# Patient Record
Sex: Female | Born: 1965 | Hispanic: No | Marital: Single | Smoking: Never smoker
Health system: Southern US, Community
[De-identification: ages and names within clinical notes are randomized; demographics above are authoritative.]

## PROBLEM LIST (undated history)

## (undated) DIAGNOSIS — E119 Type 2 diabetes mellitus without complications: Secondary | ICD-10-CM

## (undated) DIAGNOSIS — E785 Hyperlipidemia, unspecified: Secondary | ICD-10-CM

## (undated) DIAGNOSIS — I10 Essential (primary) hypertension: Secondary | ICD-10-CM

## (undated) DIAGNOSIS — D649 Anemia, unspecified: Secondary | ICD-10-CM

## (undated) DIAGNOSIS — I639 Cerebral infarction, unspecified: Secondary | ICD-10-CM

## (undated) HISTORY — DX: Essential (primary) hypertension: I10

## (undated) HISTORY — DX: Cerebral infarction, unspecified: I63.9

## (undated) HISTORY — DX: Anemia, unspecified: D64.9

## (undated) HISTORY — DX: Type 2 diabetes mellitus without complications: E11.9

---

## 1997-09-03 HISTORY — PX: THYROID CYST EXCISION: SHX2511

## 1999-09-04 HISTORY — PX: OVARIAN CYST REMOVAL: SHX89

## 2006-09-03 HISTORY — PX: KNEE ARTHROSCOPY: SUR90

## 2007-03-05 ENCOUNTER — Encounter: Payer: Self-pay | Admitting: Internal Medicine

## 2007-03-05 ENCOUNTER — Ambulatory Visit: Payer: Self-pay | Admitting: Oncology

## 2007-03-05 ENCOUNTER — Ambulatory Visit: Payer: Self-pay | Admitting: Internal Medicine

## 2007-03-16 DIAGNOSIS — R7309 Other abnormal glucose: Secondary | ICD-10-CM | POA: Insufficient documentation

## 2007-03-16 DIAGNOSIS — D472 Monoclonal gammopathy: Secondary | ICD-10-CM | POA: Insufficient documentation

## 2007-03-16 DIAGNOSIS — I10 Essential (primary) hypertension: Secondary | ICD-10-CM

## 2007-03-16 DIAGNOSIS — D259 Leiomyoma of uterus, unspecified: Secondary | ICD-10-CM | POA: Insufficient documentation

## 2007-03-16 DIAGNOSIS — G478 Other sleep disorders: Secondary | ICD-10-CM | POA: Insufficient documentation

## 2007-04-18 ENCOUNTER — Encounter: Payer: Self-pay | Admitting: Internal Medicine

## 2007-04-18 LAB — CBC WITH DIFFERENTIAL/PLATELET
Basophils Absolute: 0 10*3/uL (ref 0.0–0.1)
EOS%: 2.4 % (ref 0.0–7.0)
HCT: 30.4 % — ABNORMAL LOW (ref 34.8–46.6)
HGB: 10.2 g/dL — ABNORMAL LOW (ref 11.6–15.9)
MCH: 22.5 pg — ABNORMAL LOW (ref 26.0–34.0)
MCV: 67.1 fL — ABNORMAL LOW (ref 81.0–101.0)
MONO%: 10.2 % (ref 0.0–13.0)
NEUT%: 49.7 % (ref 39.6–76.8)
RDW: 17 % — ABNORMAL HIGH (ref 11.3–14.5)

## 2007-04-18 LAB — COMPREHENSIVE METABOLIC PANEL
Alkaline Phosphatase: 47 U/L (ref 39–117)
BUN: 9 mg/dL (ref 6–23)
Glucose, Bld: 108 mg/dL — ABNORMAL HIGH (ref 70–99)
Total Bilirubin: 0.5 mg/dL (ref 0.3–1.2)

## 2007-04-22 LAB — HEMOGLOBINOPATHY EVALUATION
Hemoglobin Other: 0 % (ref 0.0–0.0)
Hgb A2 Quant: 3.8 % — ABNORMAL HIGH (ref 2.2–3.2)
Hgb A: 63.7 % — ABNORMAL LOW (ref 96.8–97.8)
Hgb S Quant: 32.5 % — ABNORMAL HIGH (ref 0.0–0.0)

## 2007-04-22 LAB — SPEP & IFE WITH QIG
Alpha-1-Globulin: 4.4 % (ref 2.9–4.9)
Alpha-2-Globulin: 10.9 % (ref 7.1–11.8)
M-Spike, %: 0.56 g/dL
Total Protein, Serum Electrophoresis: 7 g/dL (ref 6.0–8.3)

## 2007-04-22 LAB — FERRITIN: Ferritin: 31 ng/mL (ref 10–291)

## 2007-04-22 LAB — IRON AND TIBC
%SAT: 9 % — ABNORMAL LOW (ref 20–55)
Iron: 23 ug/dL — ABNORMAL LOW (ref 42–145)
UIBC: 247 ug/dL

## 2007-05-26 ENCOUNTER — Ambulatory Visit: Payer: Self-pay | Admitting: Oncology

## 2007-05-27 ENCOUNTER — Telehealth: Payer: Self-pay | Admitting: Internal Medicine

## 2007-05-28 ENCOUNTER — Encounter: Payer: Self-pay | Admitting: Internal Medicine

## 2007-05-28 LAB — UIFE/LIGHT CHAINS/TP QN, 24-HR UR
Free Lt Chn Excr Rate: 100.43 mg/d
Total Protein, Urine-Ur/day: 108 mg/d (ref 10–140)

## 2007-08-20 ENCOUNTER — Ambulatory Visit: Payer: Self-pay | Admitting: Oncology

## 2011-01-16 NOTE — Assessment & Plan Note (Signed)
[facility]                            [facility] OFFICE NOTE   NAME:Jessica Lowe, Colleene                     MRN:          [id number]  DATE:03/05/2007                            DOB:          1965/10/28    CHIEF COMPLAINT:  New patient to establish to have knee surgery on July  18.   HISTORY OF PRESENT ILLNESS:  Ms. Jessica Lowe is a 45 year old nonsmoking,  single, African American female who comes in today for a first-time  visit.  Her parents are originally from [place] but she was born and  raised in [place] and is now in this area.  She has a diagnosis of  hypertension on medication since about 2003, history of glucose  intolerance, possible insulin resistance, and had been seen by an  Endocrinologist in [place], [place] before moving here.  Last visit we  believe was Match 2008 and she does bring records.  She had been on  metformin at one point but is off at present.  She had evaluation for a  sleep disturbance and apparently had a negative sleep study without  obstructive sleep apnea.  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  She has an  injury to her right knee and surgery is planned March 21, 2007.  Injury  sounds like a torn ligament of some sort.  She also is interested in  conception and her OB/GYN has recommended her diuretic medicine be  changed to either labetalol, aldomet, or Procardia.   PAST MEDICAL HISTORY:  See database.   SURGERIES:  1. In 1997 partial thyroidectomy.  2. In 2001 abdominal cyst removal.   She has had chickenpox in the past, borderline diabetes, hypertension,  positive TB skin test as above.   She is gravida 1, para 0, last Pap March 05, 2007.  LMP February 11, 2007.  Mammogram 2007.  Unsure of her last tetanus shot and Pneumovax October  2007.   FAMILY HISTORY:  Positive for type 2 diabetes in mother and maternal  aunt.  Uterine-type cancer in a  grandparent.   MEDICATIONS:  1. Triamterine.  2. Hydrochlorothiazide 37.5/25 one p.o. daily.  3. Calcium.  4. Multivitamins.  5. Vitamin E.   DRUG ALLERGIES:  NONE KNOWN.   SOCIAL HISTORY:  Household of 7, living with sister and their family.  Negative TAD, see database.  Employed as a Cabin crew.   On review of past records it also appears that she has a history of iron  deficiency anemia, fibroid uterus, monoclonal gammopathy.   REVIEW OF SYSTEMS:  Noted for chest pain, shortness of breath, she is  interested in weight loss and has a hard time losing weight.  She says  she has tried everything, is interested in Lap-band procedure.   OBJECTIVE:  Height 5 foot 8-3/4 inches, weight 331 pounds, pulse 66 and  regular, blood pressure 120/70.  WDWN, large but healthy appearing  middle aged lady in no acute distress.  HEENT:  Grossly normal.  NECK:  Supple without masses or adenopathy  noted.  CHEST:  CTA, BS equal.  CARDIAC:  S1-S2, no gallops or murmurs.  Peripheral pulses are present  without delay.  ABDOMEN:  Soft without organomegaly, guarding, or rebound.  NEUROLOGIC:  Appears grossly intact.   REVIEW OF LABS:  From Dr. __________ are in chart.   IMPRESSION:  1. Hypertension.  2. Glucose intolerance, probably insulin resistance.  3. Elevated body mass index.  4. Knee ligament injury, presurgical.  5. Based on chart, question of MGUS.   PLAN:  Prescription given Procardia XL 60 one p.o. daily to begin after  her surgery since her hypertension is controlled on diuretics at present  and new information given on calling surgery, considering Lap-band  although she needs to be convinced and attempting lifestyle changes  also.  Will follow up in about 6-8 weeks or as needed.     Neta Mends. Panosh, MD  Electronically Signed    WKP/MedQ  DD: 03/05/2007  DT: 03/06/2007  Job #: [id number]

## 2013-12-10 ENCOUNTER — Other Ambulatory Visit: Payer: Self-pay | Admitting: Family Medicine

## 2014-02-22 ENCOUNTER — Ambulatory Visit: Payer: Self-pay

## 2014-03-14 ENCOUNTER — Other Ambulatory Visit: Payer: Self-pay | Admitting: Family Medicine

## 2014-03-22 ENCOUNTER — Encounter: Payer: Self-pay | Admitting: Internal Medicine

## 2014-03-22 ENCOUNTER — Ambulatory Visit: Payer: Self-pay | Attending: Internal Medicine | Admitting: Internal Medicine

## 2014-03-22 VITALS — BP 125/75 | HR 82 | Temp 97.9°F | Resp 20 | Ht 70.0 in | Wt 344.6 lb

## 2014-03-22 DIAGNOSIS — I10 Essential (primary) hypertension: Secondary | ICD-10-CM

## 2014-03-22 DIAGNOSIS — E119 Type 2 diabetes mellitus without complications: Secondary | ICD-10-CM

## 2014-03-22 LAB — GLUCOSE, POCT (MANUAL RESULT ENTRY): POC Glucose: 182 mg/dl — AB (ref 70–99)

## 2014-03-22 LAB — POCT GLYCOSYLATED HEMOGLOBIN (HGB A1C): HEMOGLOBIN A1C: 6.3

## 2014-03-22 MED ORDER — METFORMIN HCL 500 MG PO TABS: 500.0000 mg | ORAL_TABLET | Freq: Every morning | ORAL | Status: DC

## 2014-03-22 MED ORDER — TRIAMTERENE-HCTZ 37.5-25 MG PO CAPS: 1.0000 | ORAL_CAPSULE | Freq: Every day | ORAL | Status: DC

## 2014-03-22 NOTE — Patient Instructions (Signed)
Diabetes and Foot Care Diabetes may cause you to have problems because of poor blood supply (circulation) to your feet and legs. This may cause the skin on your feet to become thinner, break easier, and heal more slowly. Your skin may become dry, and the skin may peel and crack. You may also have nerve damage in your legs and feet causing decreased feeling in them. You may not notice minor injuries to your feet that could lead to infections or more serious problems. Taking care of your feet is one of the most important things you can do for yourself.  HOME CARE INSTRUCTIONS  Wear shoes at all times, even in the house. Do not go barefoot. Bare feet are easily injured.  Check your feet daily for blisters, cuts, and redness. If you cannot see the bottom of your feet, use a mirror or ask someone for help.  Wash your feet with warm water (do not use hot water) and mild soap. Then pat your feet and the areas between your toes until they are completely dry. Do not soak your feet as this can dry your skin.  Apply a moisturizing lotion or petroleum jelly (that does not contain alcohol and is unscented) to the skin on your feet and to dry, brittle toenails. Do not apply lotion between your toes.  Trim your toenails straight across. Do not dig under them or around the cuticle. File the edges of your nails with an emery board or nail file.  Do not cut corns or calluses or try to remove them with medicine.  Wear clean socks or stockings every day. Make sure they are not too tight. Do not wear knee-high stockings since they may decrease blood flow to your legs.  Wear shoes that fit properly and have enough cushioning. To break in new shoes, wear them for just a few hours a day. This prevents you from injuring your feet. Always look in your shoes before you put them on to be sure there are no objects inside.  Do not cross your legs. This may decrease the blood flow to your feet.  If you find a minor scrape,  cut, or break in the skin on your feet, keep it and the skin around it clean and dry. These areas may be cleansed with mild soap and water. Do not cleanse the area with peroxide, alcohol, or iodine.  When you remove an adhesive bandage, be sure not to damage the skin around it.  If you have a wound, look at it several times a day to make sure it is healing.  Do not use heating pads or hot water bottles. They may burn your skin. If you have lost feeling in your feet or legs, you may not know it is happening until it is too late.  Make sure your health care provider performs a complete foot exam at least annually or more often if you have foot problems. Report any cuts, sores, or bruises to your health care provider immediately. SEEK MEDICAL CARE IF:   You have an injury that is not healing.  You have cuts or breaks in the skin.  You have an ingrown nail.  You notice redness on your legs or feet.  You feel burning or tingling in your legs or feet.  You have pain or cramps in your legs and feet.  Your legs or feet are numb.  Your feet always feel cold. SEEK IMMEDIATE MEDICAL CARE IF:   There is increasing redness,   swelling, or pain in or around a wound.  There is a red line that goes up your leg.  Pus is coming from a wound.  You develop a fever or as directed by your health care provider.  You notice a bad smell coming from an ulcer or wound. Document Released: 08/17/2000 Document Revised: 04/22/2013 Document Reviewed: 01/27/2013 ExitCare Patient Information 2015 [employer]. This information is not intended to replace advice given to you by your health care provider. Make sure you discuss any questions you have with your health care provider.  

## 2014-03-22 NOTE — Progress Notes (Signed)
Patient presents to establish care for HTN and DM C/O 1 week history of right mid back pain; rates 5/10. Denies injury Requesting med refills

## 2014-03-22 NOTE — Progress Notes (Signed)
Patient ID: Jessica Lowe, female   DOB: 1966/02/16, 48 y.o.   MRN: [id number]   EGB:[id number]  [id number]:[id number]  DOB - 1965-11-26  CC:  Chief Complaint  Patient presents with  . Establish Care  . Diabetes  . Hypertension       HPI: Jessica Lowe is a 48 y.o. female here today to establish medical care.  Patient has a past medical history of DM and HTN.  She is currently taking Metformin 500 mg once daily and has been on that same dose for over one year and has been controlled.  Patient reports compliance with medication management.    Patient has No headache, No chest pain, No abdominal pain - No Nausea, No new weakness tingling or numbness, No Cough - SOB.  No Known Allergies Past Medical History  Diagnosis Date  . Anemia   . Diabetes mellitus without complication   . Hypertension    No current outpatient prescriptions on file prior to visit.   No current facility-administered medications on file prior to visit.   Family History  Problem Relation Age of Onset  . Diabetes Mother   . Hypertension Mother   . Prostatitis Father   . Hypertension Father    History   Social History  . Marital Status: Single    Spouse Name: N/A    Number of Children: N/A  . Years of Education: N/A   Occupational History  . Not on file.   Social History Main Topics  . Smoking status: Never Smoker   . Smokeless tobacco: Not on file  . Alcohol Use: No  . Drug Use: No  . Sexual Activity: Not on file   Other Topics Concern  . Not on file   Social History Narrative  . No narrative on file   Review of Systems  Eyes: Positive for blurred vision.  Respiratory: Negative.   Cardiovascular: Negative.   Gastrointestinal: Negative.   Genitourinary: Negative.   Musculoskeletal: Negative.   Neurological: Negative for dizziness, tingling and headaches.  Endo/Heme/Allergies: Negative for polydipsia.  Psychiatric/Behavioral: Negative.        Objective:   Filed Vitals:   03/22/14 1432  BP: 125/75  Pulse: 82  Temp: 97.9 F (36.6 C)  Resp: 20    Physical Exam: Constitutional: Patient appears well-developed and well-nourished. No distress. HENT: Normocephalic, atraumatic, External right and left ear normal. Oropharynx is clear and moist.  Eyes: Conjunctivae and EOM are normal. PERRLA, no scleral icterus. Neck: Normal ROM. Neck supple. No JVD. No tracheal deviation. No thyromegaly. CVS: RRR, S1/S2 +, no murmurs, no gallops, no carotid bruit.  Pulmonary: Effort and breath sounds normal, no stridor, rhonchi, wheezes, rales.  Abdominal: Soft. BS +, no distension, tenderness, rebound or guarding.  Musculoskeletal: Normal range of motion. No edema and no tenderness.  Lymphadenopathy: No lymphadenopathy noted, cervical, inguinal or axillary Neuro: Alert. Normal reflexes, muscle tone coordination. No cranial nerve deficit. Skin: Skin is warm and dry. No rash noted. Not diaphoretic. No erythema. No pallor. Psychiatric: Normal mood and affect. Behavior, judgment, thought content normal.  Lab Results  Component Value Date   WBC 5.0 04/18/2007   HGB 10.2* 04/18/2007   HCT 30.4* 04/18/2007   MCV 67.1* 04/18/2007   PLT 389 04/18/2007   Lab Results  Component Value Date   CREATININE 0.71 04/18/2007   BUN 9 04/18/2007   NA 140 04/18/2007   K 3.5 04/18/2007   CL 104 04/18/2007   CO2 27 04/18/2007    Lab Results  Component Value Date   HGBA1C 6.3 03/22/2014   Lipid Panel  No results found for this basename: chol, trig, hdl, cholhdl, vldl, ldlcalc       Assessment and plan:   Jessica Lowe was seen today for establish care, diabetes and hypertension.  Diagnoses and associated orders for this visit:  Type II or unspecified type diabetes mellitus without mention of complication, not stated as uncontrolled - Glucose (CBG) - HgB A1c - metFORMIN (GLUCOPHAGE) 500 MG tablet; Take 1 tablet (500 mg total) by mouth every morning. Will continue same dose - Ambulatory  referral to Ophthalmology - Lipid panel; Future - COMPLETE METABOLIC PANEL WITH GFR; Future - CBC; Future  HYPERTENSION - May continue current med triamterene-hydrochlorothiazide (DYAZIDE) 37.5-25 MG per capsule; Take 1 each (1 capsule total) by mouth daily.   Return for Lab Visit soon and 3 mo PCP, DM/HTN.   Chari Manning, NP-C [facility] [phone] 03/23/2014, 7:40 PM

## 2014-03-23 ENCOUNTER — Encounter: Payer: Self-pay | Admitting: Internal Medicine

## 2014-04-16 ENCOUNTER — Ambulatory Visit: Payer: Self-pay

## 2014-04-16 ENCOUNTER — Other Ambulatory Visit: Payer: Self-pay

## 2014-04-20 ENCOUNTER — Ambulatory Visit: Payer: Self-pay | Attending: Internal Medicine

## 2014-04-20 DIAGNOSIS — E119 Type 2 diabetes mellitus without complications: Secondary | ICD-10-CM

## 2014-04-20 LAB — COMPLETE METABOLIC PANEL WITH GFR
ALK PHOS: 64 U/L (ref 39–117)
ALT: 10 U/L (ref 0–35)
AST: 12 U/L (ref 0–37)
Albumin: 3.9 g/dL (ref 3.5–5.2)
BILIRUBIN TOTAL: 0.4 mg/dL (ref 0.2–1.2)
BUN: 10 mg/dL (ref 6–23)
CO2: 33 mEq/L — ABNORMAL HIGH (ref 19–32)
Calcium: 9.3 mg/dL (ref 8.4–10.5)
Chloride: 100 mEq/L (ref 96–112)
Creat: 0.63 mg/dL (ref 0.50–1.10)
GFR, Est African American: >89 mL/min
Glucose, Bld: 102 mg/dL — ABNORMAL HIGH (ref 70–99)
Potassium: 3.9 mEq/L (ref 3.5–5.3)
Sodium: 138 mEq/L (ref 135–145)
Total Protein: 7.6 g/dL (ref 6.0–8.3)

## 2014-04-20 LAB — LIPID PANEL
CHOL/HDL RATIO: 3 ratio
CHOLESTEROL: 187 mg/dL (ref 0–200)
HDL: 62 mg/dL (ref >39)
LDL Cholesterol: 112 mg/dL — ABNORMAL HIGH (ref 0–99)
Triglycerides: 67 mg/dL (ref <150)
VLDL: 13 mg/dL (ref 0–40)

## 2014-04-20 LAB — CBC
HEMATOCRIT: 37.7 % (ref 36.0–46.0)
HEMOGLOBIN: 12.7 g/dL (ref 12.0–15.0)
MCH: 24.5 pg — ABNORMAL LOW (ref 26.0–34.0)
MCHC: 33.7 g/dL (ref 30.0–36.0)
MCV: 72.8 fL — ABNORMAL LOW (ref 78.0–100.0)
Platelets: 331 10*3/uL (ref 150–400)
RBC: 5.18 MIL/uL — AB (ref 3.87–5.11)
RDW: 15.5 % (ref 11.5–15.5)
WBC: 5.7 10*3/uL (ref 4.0–10.5)

## 2014-04-21 ENCOUNTER — Other Ambulatory Visit: Payer: Self-pay

## 2014-04-26 ENCOUNTER — Telehealth: Payer: Self-pay | Admitting: *Deleted

## 2014-04-26 NOTE — Telephone Encounter (Signed)
Message copied by Betti Cruz on Mon Apr 26, 2014  4:13 PM ------      Message from: Chari Manning A      Created: Fri Apr 23, 2014  9:22 AM       Labs look good, please educate on cholesterol-regarding diet and exercise ------

## 2014-04-26 NOTE — Telephone Encounter (Signed)
Try both number provided, both number were wrong business numbers

## 2014-05-20 ENCOUNTER — Ambulatory Visit (INDEPENDENT_AMBULATORY_CARE_PROVIDER_SITE_OTHER): Payer: Self-pay | Admitting: Home Health Services

## 2014-05-20 ENCOUNTER — Ambulatory Visit: Payer: Self-pay | Attending: Internal Medicine

## 2014-05-20 DIAGNOSIS — E119 Type 2 diabetes mellitus without complications: Secondary | ICD-10-CM

## 2014-05-20 NOTE — Progress Notes (Signed)
DIABETES Pt came in to have a retinal scan per diabetic care.   Image was taken and submitted to [facility]-DR. [name] for reading.    Results will be available in 1-2 weeks.  Results will be given to PCP for review and to contact patient.  Jessica Lowe

## 2014-06-18 ENCOUNTER — Other Ambulatory Visit: Payer: Self-pay

## 2014-10-22 ENCOUNTER — Ambulatory Visit: Payer: Self-pay | Admitting: Internal Medicine

## 2014-11-10 ENCOUNTER — Ambulatory Visit: Payer: Self-pay

## 2014-11-15 ENCOUNTER — Other Ambulatory Visit: Payer: Self-pay | Admitting: Internal Medicine

## 2014-11-16 ENCOUNTER — Telehealth: Payer: Self-pay | Admitting: Internal Medicine

## 2014-11-16 NOTE — Telephone Encounter (Signed)
Pt called needs medication refill for  metFORMIN (GLUCOPHAGE) 500 MG tablet. Pt has an appt set up for 11/26/14 . Pt would like enough medication to last her until appt . Please f/u with pt. Pharmacy that pt would like medication sent to is [facility] on [address].

## 2014-11-17 ENCOUNTER — Telehealth: Payer: Self-pay | Admitting: Internal Medicine

## 2014-11-17 NOTE — Telephone Encounter (Signed)
Pt is calling requesting medication refill for metFORMIN (GLUCOPHAGE) 500 MG tablet, pt is completely out of medication. Please f/u with pt

## 2014-11-19 ENCOUNTER — Other Ambulatory Visit: Payer: Self-pay | Admitting: Emergency Medicine

## 2014-11-19 DIAGNOSIS — R7301 Impaired fasting glucose: Secondary | ICD-10-CM

## 2014-11-19 MED ORDER — METFORMIN HCL 500 MG PO TABS: 500.0000 mg | ORAL_TABLET | Freq: Every day | ORAL | Status: DC

## 2014-11-26 ENCOUNTER — Ambulatory Visit: Payer: Self-pay | Attending: Internal Medicine | Admitting: Internal Medicine

## 2014-11-26 ENCOUNTER — Encounter: Payer: Self-pay | Admitting: Internal Medicine

## 2014-11-26 VITALS — BP 121/75 | HR 81 | Temp 97.6°F | Resp 16 | Ht 70.0 in | Wt 355.0 lb

## 2014-11-26 DIAGNOSIS — R7611 Nonspecific reaction to tuberculin skin test without active tuberculosis: Secondary | ICD-10-CM

## 2014-11-26 DIAGNOSIS — Z9289 Personal history of other medical treatment: Secondary | ICD-10-CM

## 2014-11-26 DIAGNOSIS — E119 Type 2 diabetes mellitus without complications: Secondary | ICD-10-CM

## 2014-11-26 DIAGNOSIS — I10 Essential (primary) hypertension: Secondary | ICD-10-CM

## 2014-11-26 LAB — GLUCOSE, POCT (MANUAL RESULT ENTRY): POC Glucose: 112 mg/dl — AB (ref 70–99)

## 2014-11-26 LAB — POCT GLYCOSYLATED HEMOGLOBIN (HGB A1C): HEMOGLOBIN A1C: 6.3

## 2014-11-26 MED ORDER — METFORMIN HCL 500 MG PO TABS: 500.0000 mg | ORAL_TABLET | Freq: Every day | ORAL | Status: AC

## 2014-11-26 MED ORDER — TRIAMTERENE-HCTZ 37.5-25 MG PO CAPS: 1.0000 | ORAL_CAPSULE | Freq: Every day | ORAL | Status: DC

## 2014-11-26 NOTE — Progress Notes (Signed)
Patient ID: Jessica Lowe, female   DOB: 06-07-1966, 49 y.o.   MRN: [id number] 1. HTN: Medication: Dyazide Home BP monitoring: Does not check  Positive ROS none Negative ROS: headaches, chest pain, palpitations, shortness of breath, edema    2. DM2:  Medication: Metformin  Home CBG monitoring: Does not currently check Hypoglycemic event: None Positive ROS n/a Negative ROS: Neuropathy, blurred vision, polyuria, polydipsia  Patient reports 1.5 weeks right side head numbness and left neck numbness.  Now resolved.  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   Social History reviewed: Smoker never Exercise not currently  Physical Exam  Cardiovascular: Normal rate, regular rhythm and normal heart sounds.   Pulses:      Dorsalis pedis pulses are 2+ on the right side, and 2+ on the left side.       Posterior tibial pulses are 2+ on the right side, and 2+ on the left side.  Pulmonary/Chest: Effort normal and breath sounds normal.  Feet:  Right Foot:  Skin Integrity: Negative for skin breakdown.  Left Foot:  Skin Integrity: Negative for skin breakdown.   Diabetic Foot Exam - Simple   Simple Foot Form  Diabetic Foot exam was performed with the following findings:  Yes 11/26/2014  6:00 PM  Visual Inspection  No deformities, no ulcerations, no other skin breakdown bilaterally:  Yes  Sensation Testing  Intact to touch and monofilament testing bilaterally:  Yes  Pulse Check  Posterior Tibialis and Dorsalis pulse intact bilaterally:  Yes  Comments     Fatimata was seen today for follow-up.  Diagnoses and all orders for this visit:  Type 2 diabetes mellitus without complication Orders: -     Glucose (CBG) -     HgB A1c -     Refill metFORMIN (GLUCOPHAGE) 500 MG tablet; Take 1 tablet (500 mg total) by mouth daily with breakfast. Patients  diabetes is well control as evidence by consistently low a1c.  Patient will continue with current therapy and continue to make necessary lifestyle changes.  Reviewed foot care, diet, exercise, annual health maintenance with patient.   Essential hypertension Orders: -     triamterene-hydrochlorothiazide (DYAZIDE) 37.5-25 MG per capsule; Take 1 each (1 capsule total) by mouth daily. Patient blood pressure is stable and may continue on current medication.  Education on diet, exercise, and modifiable risk factors discussed. Will obtain appropriate labs as needed. Will follow up in 3-6 months.   Morbid obesity Weight loss discussed at length and its complications to health.  Patient will loss 10 months by next visit in 3 months.  Diet and exercise discussed as well as calorie intake.  History of positive PPD Orders: -     DG Chest 2 View; Future   Return in about 1 year (around 11/26/2015) for DM/HTN.  Chari Manning, NP 12/21/2014 1:11 PM

## 2014-11-26 NOTE — Progress Notes (Signed)
Pt is here following up on her HTN and diabetes. Pt states that for the past 2 weeks left side of her head neck and shoulders has been in a dull pain.

## 2014-11-26 NOTE — Patient Instructions (Signed)
Exercise to Lose Weight Exercise and a healthy diet may help you lose weight. Your doctor may suggest specific exercises. EXERCISE IDEAS AND TIPS  Choose low-cost things you enjoy doing, such as walking, bicycling, or exercising to workout videos.  Take stairs instead of the elevator.  Walk during your lunch break.  Park your car further away from work or school.  Go to a gym or an exercise class.  Start with 5 to 10 minutes of exercise each day. Build up to 30 minutes of exercise 4 to 6 days a week.  Wear shoes with good support and comfortable clothes.  Stretch before and after working out.  Work out until you breathe harder and your heart beats faster.  Drink extra water when you exercise.  Do not do so much that you hurt yourself, feel dizzy, or get very short of breath. Exercises that burn about 150 calories:  Running 1  miles in 15 minutes.  Playing volleyball for 45 to 60 minutes.  Washing and waxing a car for 45 to 60 minutes.  Playing touch football for 45 minutes.  Walking 1  miles in 35 minutes.  Pushing a stroller 1  miles in 30 minutes.  Playing basketball for 30 minutes.  Raking leaves for 30 minutes.  Bicycling 5 miles in 30 minutes.  Walking 2 miles in 30 minutes.  Dancing for 30 minutes.  Shoveling snow for 15 minutes.  Swimming laps for 20 minutes.  Walking up stairs for 15 minutes.  Bicycling 4 miles in 15 minutes.  Gardening for 30 to 45 minutes.  Jumping rope for 15 minutes.  Washing windows or floors for 45 to 60 minutes. Document Released: 09/22/2010 Document Revised: 11/12/2011 Document Reviewed: 09/22/2010 ExitCare Patient Information 2015 [employer]. This information is not intended to replace advice given to you by your health care provider. Make sure you discuss any questions you have with your health care provider.  

## 2014-12-03 ENCOUNTER — Ambulatory Visit (HOSPITAL_COMMUNITY)
Admission: RE | Admit: 2014-12-03 | Discharge: 2014-12-03 | Disposition: A | Discharge status: Home / Self Care | Payer: No Typology Code available for payment source | Source: Ambulatory Visit | Attending: Internal Medicine | Admitting: Internal Medicine

## 2014-12-03 DIAGNOSIS — R7611 Nonspecific reaction to tuberculin skin test without active tuberculosis: Secondary | ICD-10-CM | POA: Insufficient documentation

## 2014-12-03 DIAGNOSIS — Z9289 Personal history of other medical treatment: Secondary | ICD-10-CM

## 2014-12-21 ENCOUNTER — Telehealth: Payer: Self-pay | Admitting: *Deleted

## 2014-12-21 NOTE — Telephone Encounter (Signed)
-----   Message from Lance Bosch, NP sent at 12/07/2014 11:41 AM EDT ----- Negative chest xray. You may print copy for patient if she needs it to give to her job

## 2014-12-21 NOTE — Telephone Encounter (Signed)
Pt is aware of her xray results.

## 2014-12-31 ENCOUNTER — Ambulatory Visit: Payer: Self-pay

## 2015-02-16 ENCOUNTER — Telehealth: Payer: Self-pay | Admitting: Internal Medicine

## 2015-02-16 NOTE — Telephone Encounter (Signed)
Pt called requesting referral to dentist and ophthalmologist. Please f/u with patient

## 2015-02-18 ENCOUNTER — Other Ambulatory Visit: Payer: Self-pay | Admitting: Internal Medicine

## 2015-02-18 ENCOUNTER — Telehealth: Payer: Self-pay | Admitting: Internal Medicine

## 2015-02-18 ENCOUNTER — Ambulatory Visit: Payer: Self-pay | Attending: Internal Medicine | Admitting: Family Medicine

## 2015-02-18 VITALS — BP 144/81 | HR 71 | Temp 98.2°F | Resp 20 | Ht 70.0 in | Wt 359.2 lb

## 2015-02-18 DIAGNOSIS — K047 Periapical abscess without sinus: Secondary | ICD-10-CM

## 2015-02-18 DIAGNOSIS — K0889 Other specified disorders of teeth and supporting structures: Secondary | ICD-10-CM

## 2015-02-18 MED ORDER — AMOXICILLIN 500 MG PO CAPS: 500.0000 mg | ORAL_CAPSULE | Freq: Three times a day (TID) | ORAL | Status: DC

## 2015-02-18 MED ORDER — TRAMADOL HCL 50 MG PO TABS: 50.0000 mg | ORAL_TABLET | Freq: Three times a day (TID) | ORAL | Status: AC | PRN

## 2015-02-18 NOTE — Progress Notes (Signed)
Patient here for dental referral. Patient reports that she has an infected tooth on the right side of her mouth and requests antibiotics. Pain rated at 8, stabbing and throbbing, constant pain.  Pain started over a month ago. Patient has used a number of pain medications but nothing has help. Patient reports having a hard time eating.   Patient CBG 123.

## 2015-02-18 NOTE — Progress Notes (Signed)
Subjective:     Patient ID: Jessica Lowe, female   DOB: 15-Apr-1966, 49 y.o.   MRN: [id number]  HPI   Patient presents today with a toothache. This has been bothering her for about a month but she has been unable to get a dental referral due to lack of orange card. She now has an orange card and would like a referral. The tooth ache has been worsening over the last two weeks and she is concerned about an infection. She reports she is having trouble eating and sleeping due to the pain. She reports the gums being sore on the bottom from midline back to back teeth.   Review of Systems   She denies fever or chills Otherwise as in HPI     Objective:   Physical Exam   Alert, oriented appropriate Skin ward and dry Neck is supple FROM without adenopathy or tenderness. There is obvious decay present on the lower left. The gums are puffy, with hightened erythema and tenderness.     Assessment:     Dental decay with superimposed infections    Plan:     Amoxicillin 500 mg, #30, one po tid for 10 days Referral to dentist Follow-up as needed.    Micheline Chapman, FNP [facility]

## 2015-02-18 NOTE — Telephone Encounter (Signed)
Patient has come in today to request a dental referral because she is having dental pain and seem to think it may be infected; please f/u with patient in the lobby to see if antibiotics are needed

## 2015-02-18 NOTE — Patient Instructions (Signed)
Take antibiotic as bottle instructions, one every 8 hours for 10 days. Tramadol is for pain and may cause drowsiness of dizziness. Do no drive if taking We will send in a dental referral and someone will call you.

## 2015-09-09 MED FILL — metFORMIN HCL 500 MG TABS: 500 | 90 days supply | Qty: 90 | Fill #3

## 2015-09-09 MED FILL — TRIAMTERENE-HCTZ 37.5-25 MG: 37.5-25 | 90 days supply | Qty: 90 | Fill #3

## 2015-09-19 ENCOUNTER — Ambulatory Visit: Payer: Self-pay

## 2017-11-20 ENCOUNTER — Other Ambulatory Visit: Payer: Self-pay

## 2017-11-20 ENCOUNTER — Encounter (HOSPITAL_COMMUNITY): Payer: Self-pay | Admitting: *Deleted

## 2017-11-20 ENCOUNTER — Emergency Department (HOSPITAL_COMMUNITY): Payer: 59

## 2017-11-20 ENCOUNTER — Emergency Department (HOSPITAL_COMMUNITY)
Admission: EM | Admit: 2017-11-20 | Discharge: 2017-11-21 | Disposition: A | Discharge status: Home / Self Care | Payer: 59 | Attending: Emergency Medicine | Admitting: Emergency Medicine

## 2017-11-20 DIAGNOSIS — Z79899 Other long term (current) drug therapy: Secondary | ICD-10-CM | POA: Diagnosis not present

## 2017-11-20 DIAGNOSIS — R51 Headache: Secondary | ICD-10-CM | POA: Diagnosis not present

## 2017-11-20 DIAGNOSIS — Z7984 Long term (current) use of oral hypoglycemic drugs: Secondary | ICD-10-CM | POA: Diagnosis not present

## 2017-11-20 DIAGNOSIS — E119 Type 2 diabetes mellitus without complications: Secondary | ICD-10-CM | POA: Diagnosis not present

## 2017-11-20 DIAGNOSIS — I10 Essential (primary) hypertension: Secondary | ICD-10-CM | POA: Insufficient documentation

## 2017-11-20 DIAGNOSIS — Z7982 Long term (current) use of aspirin: Secondary | ICD-10-CM | POA: Diagnosis not present

## 2017-11-20 DIAGNOSIS — G44209 Tension-type headache, unspecified, not intractable: Secondary | ICD-10-CM

## 2017-11-20 LAB — CBC
HEMATOCRIT: 38.4 % (ref 36.0–46.0)
Hemoglobin: 12.4 g/dL (ref 12.0–15.0)
MCH: 23.6 pg — ABNORMAL LOW (ref 26.0–34.0)
MCHC: 32.3 g/dL (ref 30.0–36.0)
MCV: 73.1 fL — ABNORMAL LOW (ref 78.0–100.0)
Platelets: 307 10*3/uL (ref 150–400)
RBC: 5.25 MIL/uL — AB (ref 3.87–5.11)
RDW: 15.6 % — AB (ref 11.5–15.5)
WBC: 6.5 10*3/uL (ref 4.0–10.5)

## 2017-11-20 LAB — COMPREHENSIVE METABOLIC PANEL
ALK PHOS: 68 U/L (ref 38–126)
ALT: 11 U/L — AB (ref 14–54)
AST: 17 U/L (ref 15–41)
Albumin: 3.6 g/dL (ref 3.5–5.0)
Anion gap: 10 (ref 5–15)
BUN: 6 mg/dL (ref 6–20)
CALCIUM: 9.1 mg/dL (ref 8.9–10.3)
CO2: 29 mmol/L (ref 22–32)
CREATININE: 0.69 mg/dL (ref 0.44–1.00)
Chloride: 101 mmol/L (ref 101–111)
Glucose, Bld: 91 mg/dL (ref 65–99)
Potassium: 3.5 mmol/L (ref 3.5–5.1)
Sodium: 140 mmol/L (ref 135–145)
Total Bilirubin: 0.5 mg/dL (ref 0.3–1.2)
Total Protein: 7.5 g/dL (ref 6.5–8.1)

## 2017-11-20 LAB — I-STAT TROPONIN, ED: TROPONIN I, POC: 0.01 ng/mL (ref 0.00–0.08)

## 2017-11-20 LAB — PROTIME-INR
INR: 1.03
Prothrombin Time: 13.4 seconds (ref 11.4–15.2)

## 2017-11-20 LAB — DIFFERENTIAL
Basophils Absolute: 0 10*3/uL (ref 0.0–0.1)
Basophils Relative: 0 %
Eosinophils Absolute: 0.1 10*3/uL (ref 0.0–0.7)
Eosinophils Relative: 2 %
LYMPHS PCT: 28 %
Lymphs Abs: 1.8 10*3/uL (ref 0.7–4.0)
MONO ABS: 0.5 10*3/uL (ref 0.1–1.0)
MONOS PCT: 8 %
NEUTROS ABS: 4 10*3/uL (ref 1.7–7.7)
Neutrophils Relative %: 62 %

## 2017-11-20 LAB — APTT: aPTT: 35 seconds (ref 24–36)

## 2017-11-20 LAB — I-STAT CHEM 8, ED
BUN: 6 mg/dL (ref 6–20)
Calcium, Ion: 1.17 mmol/L (ref 1.15–1.40)
Chloride: 100 mmol/L — ABNORMAL LOW (ref 101–111)
Creatinine, Ser: 0.7 mg/dL (ref 0.44–1.00)
GLUCOSE: 93 mg/dL (ref 65–99)
HCT: 40 % (ref 36.0–46.0)
HEMOGLOBIN: 13.6 g/dL (ref 12.0–15.0)
Potassium: 3.5 mmol/L (ref 3.5–5.1)
Sodium: 142 mmol/L (ref 135–145)
TCO2: 31 mmol/L (ref 22–32)

## 2017-11-20 NOTE — ED Triage Notes (Signed)
The pt is c/o a headache blurred  Vision nausea  Elevated bp  Today other symptoms for 3 months

## 2017-11-21 MED ORDER — IBUPROFEN 800 MG PO TABS
800.0000 mg | ORAL_TABLET | Freq: Once | ORAL | Status: AC
  Administered 2017-11-21: 800 mg via ORAL
  Filled 2017-11-21: qty 1

## 2017-11-21 NOTE — ED Notes (Signed)
Visual acuity R 20/16 L 20/16 Both 20/12.5

## 2017-11-21 NOTE — Discharge Instructions (Addendum)
.  You may alternate Tylenol 1000 mg every 6 hours as needed for pain and Ibuprofen 800 mg every 8 hours as needed for pain.  Please take Ibuprofen with food.   Please continue your blood pressure medications as prescribed.  Please follow-up with your primary care physician.  Your labs and head CT were normal today.

## 2017-11-21 NOTE — ED Provider Notes (Signed)
TIME SEEN: 12:16 AM  CHIEF COMPLAINT: Hypertension  HPI: Patient is a 52 year old female with history of hypertension, diabetes who presents to the emergency department with complaints of elevated blood pressure.  He states that for 3 months she has had posterior throbbing headaches that radiate into her forehead with intermittent blurry vision.  Took her blood pressure at home and it was 676 systolic.  Reports she has never this elevated.  She was previously on triamterene-HCTZ and was taken off 2 days ago by her primary care physician and placed on temilsartan and HCTZ.  Still having mild headache but no blurry vision.  No chest pain or shortness of breath.  No numbness, tingling or focal weakness.  No head injury.  Not on blood thinners.  Has not tried any medication at home for her headache.  ROS: See HPI Constitutional: no fever  Eyes: no drainage  ENT: no runny nose   Cardiovascular:  no chest pain  Resp: no SOB  GI: no vomiting GU: no dysuria Integumentary: no rash  Allergy: no hives  Musculoskeletal: no leg swelling  Neurological: no slurred speech ROS otherwise negative  PAST MEDICAL HISTORY/PAST SURGICAL HISTORY:  Past Medical History:  Diagnosis Date  . Anemia   . Diabetes mellitus without complication ([place])   . Hypertension     MEDICATIONS:  Prior to Admission medications   Medication Sig Start Date End Date Taking? Authorizing Provider  amoxicillin (AMOXIL) 500 MG capsule Take 1 capsule (500 mg total) by mouth 3 (three) times daily. 02/18/15   Micheline Chapman, NP  aspirin 81 MG EC tablet Take 81 mg by mouth daily. Swallow whole.    [provider]  metFORMIN (GLUCOPHAGE) 500 MG tablet Take 1 tablet (500 mg total) by mouth daily with breakfast. 11/26/14   Lance Bosch, NP  Multiple Vitamin (MULTIVITAMIN WITH MINERALS) TABS tablet Take 1 tablet by mouth daily. 03/22/12   [provider]  traMADol (ULTRAM) 50 MG tablet Take 1 tablet (50 mg total) by  mouth every 8 (eight) hours as needed. 02/18/15   Micheline Chapman, NP  triamterene-hydrochlorothiazide (DYAZIDE) 37.5-25 MG per capsule Take 1 each (1 capsule total) by mouth daily. 11/26/14   Lance Bosch, NP    ALLERGIES:  No Known Allergies  SOCIAL HISTORY:  Social History   Tobacco Use  . Smoking status: Never Smoker  . Smokeless tobacco: Never Used  Substance Use Topics  . Alcohol use: No    FAMILY HISTORY: Family History  Problem Relation Age of Onset  . Diabetes Mother   . Hypertension Mother   . Prostatitis Father   . Hypertension Father     EXAM: BP (!) 154/82 (BP Location: Right Arm)   Pulse 78   Temp 98.2 F (36.8 C) (Oral)   Resp 18   Ht 5\' 8"  (1.727 m)   Wt (!) 164.2 kg (362 lb)   LMP 03/22/2013   SpO2 100%   BMI 55.04 kg/m  CONSTITUTIONAL: Alert and oriented and responds appropriately to questions. Well-appearing; well-nourished HEAD: Normocephalic EYES: Conjunctivae clear, pupils appear equal, EOMI ENT: normal nose; moist mucous membranes NECK: Supple, no meningismus, no nuchal rigidity, no LAD  CARD: RRR; S1 and S2 appreciated; no murmurs, no clicks, no rubs, no gallops RESP: Normal chest excursion without splinting or tachypnea; breath sounds clear and equal bilaterally; no wheezes, no rhonchi, no rales, no hypoxia or respiratory distress, speaking full sentences ABD/GI: Normal bowel sounds; non-distended; soft, non-tender, no rebound, no  guarding, no peritoneal signs, no hepatosplenomegaly BACK:  The back appears normal and is non-tender to palpation, there is no CVA tenderness EXT: Normal ROM in all joints; non-tender to palpation; no edema; normal capillary refill; no cyanosis, no calf tenderness or swelling    SKIN: Normal color for age and race; warm; no rash NEURO: Moves all extremities equally, sensation to light touch intact diffusely, cranial nerves II through XII intact, strength 5/5 in all 4 extremities, normal speech, normal  gait PSYCH: The patient's mood and manner are appropriate. Grooming and personal hygiene are appropriate.  MEDICAL DECISION MAKING: Patient here with complaints of hypertension.  Blood pressure here has been in the 150s/80s consistently.  She had labs, head CT were ordered in triage and are completely unremarkable as is her EKG.  No focal neurologic deficit that this time.  She describes her headache likely tension headache.  I do not think she is intracranial hemorrhage.  She has had these headaches for 3 months.  Will give ibuprofen.  She states she is ready for discharge home.  Have offered her IV medications to help get her headache better but she states she feels oral medications will be sufficient.  I have advised her to continue her blood pressure and diabetes medications.  Her blood sugar here is normal.  Her labs are completely unremarkable.  I feel she is safe for discharge home.  At this time, I do not feel there is any life-threatening condition present. I have reviewed and discussed all results (EKG, imaging, lab, urine as appropriate) and exam findings with patient/family. I have reviewed nursing notes and appropriate previous records.  I feel the patient is safe to be discharged home without further emergent workup and can continue workup as an outpatient as needed. Discussed usual and customary return precautions. Patient/family verbalize understanding and are comfortable with this plan.  Outpatient follow-up has been provided if needed. All questions have been answered.     EKG Interpretation  Date/Time:  Wednesday November 20 2017 20:46:35 EDT Ventricular Rate:  84 PR Interval:  176 QRS Duration: 80 QT Interval:  366 QTC Calculation: 432 R Axis:   12 Text Interpretation:  Normal sinus rhythm Normal ECG No old tracing to compare Confirmed by Patrick Salemi, Cyril Mourning [phone]) on 11/20/2017 11:55:28 PM          Cleota Pellerito, Delice Bison, DO 11/21/17 0423

## 2018-08-04 ENCOUNTER — Emergency Department (HOSPITAL_COMMUNITY): Payer: 59

## 2018-08-04 ENCOUNTER — Observation Stay (HOSPITAL_COMMUNITY)
Admission: EM | Admit: 2018-08-04 | Discharge: 2018-08-06 | Disposition: A | Discharge status: Home / Self Care | Payer: 59 | Attending: Family Medicine | Admitting: Family Medicine

## 2018-08-04 DIAGNOSIS — E1165 Type 2 diabetes mellitus with hyperglycemia: Secondary | ICD-10-CM | POA: Diagnosis not present

## 2018-08-04 DIAGNOSIS — R531 Weakness: Secondary | ICD-10-CM | POA: Insufficient documentation

## 2018-08-04 DIAGNOSIS — I110 Hypertensive heart disease with heart failure: Secondary | ICD-10-CM | POA: Insufficient documentation

## 2018-08-04 DIAGNOSIS — E039 Hypothyroidism, unspecified: Secondary | ICD-10-CM | POA: Insufficient documentation

## 2018-08-04 DIAGNOSIS — D509 Iron deficiency anemia, unspecified: Secondary | ICD-10-CM | POA: Diagnosis not present

## 2018-08-04 DIAGNOSIS — I7771 Dissection of carotid artery: Secondary | ICD-10-CM | POA: Diagnosis not present

## 2018-08-04 DIAGNOSIS — Z79899 Other long term (current) drug therapy: Secondary | ICD-10-CM | POA: Diagnosis not present

## 2018-08-04 DIAGNOSIS — Z6841 Body Mass Index (BMI) 40.0 and over, adult: Secondary | ICD-10-CM | POA: Diagnosis not present

## 2018-08-04 DIAGNOSIS — I440 Atrioventricular block, first degree: Secondary | ICD-10-CM | POA: Insufficient documentation

## 2018-08-04 DIAGNOSIS — E785 Hyperlipidemia, unspecified: Secondary | ICD-10-CM | POA: Diagnosis present

## 2018-08-04 DIAGNOSIS — E119 Type 2 diabetes mellitus without complications: Secondary | ICD-10-CM

## 2018-08-04 DIAGNOSIS — R9089 Other abnormal findings on diagnostic imaging of central nervous system: Secondary | ICD-10-CM | POA: Insufficient documentation

## 2018-08-04 DIAGNOSIS — I7774 Dissection of vertebral artery: Secondary | ICD-10-CM | POA: Diagnosis not present

## 2018-08-04 DIAGNOSIS — Z7982 Long term (current) use of aspirin: Secondary | ICD-10-CM | POA: Diagnosis not present

## 2018-08-04 DIAGNOSIS — I5030 Unspecified diastolic (congestive) heart failure: Secondary | ICD-10-CM | POA: Insufficient documentation

## 2018-08-04 DIAGNOSIS — I10 Essential (primary) hypertension: Secondary | ICD-10-CM | POA: Diagnosis present

## 2018-08-04 DIAGNOSIS — Z8249 Family history of ischemic heart disease and other diseases of the circulatory system: Secondary | ICD-10-CM | POA: Diagnosis not present

## 2018-08-04 DIAGNOSIS — Z833 Family history of diabetes mellitus: Secondary | ICD-10-CM | POA: Diagnosis not present

## 2018-08-04 DIAGNOSIS — Z7984 Long term (current) use of oral hypoglycemic drugs: Secondary | ICD-10-CM | POA: Insufficient documentation

## 2018-08-04 HISTORY — DX: Hyperlipidemia, unspecified: E78.5

## 2018-08-04 LAB — DIFFERENTIAL
Abs Immature Granulocytes: 0.03 10*3/uL (ref 0.00–0.07)
Basophils Absolute: 0 10*3/uL (ref 0.0–0.1)
Basophils Relative: 0 %
Eosinophils Absolute: 0.2 10*3/uL (ref 0.0–0.5)
Eosinophils Relative: 3 %
Immature Granulocytes: 0 %
LYMPHS PCT: 36 %
Lymphs Abs: 2.5 10*3/uL (ref 0.7–4.0)
Monocytes Absolute: 0.8 10*3/uL (ref 0.1–1.0)
Monocytes Relative: 11 %
Neutro Abs: 3.5 10*3/uL (ref 1.7–7.7)
Neutrophils Relative %: 50 %

## 2018-08-04 LAB — COMPREHENSIVE METABOLIC PANEL
ALT: 14 U/L (ref 0–44)
AST: 16 U/L (ref 15–41)
Albumin: 3.4 g/dL — ABNORMAL LOW (ref 3.5–5.0)
Alkaline Phosphatase: 56 U/L (ref 38–126)
Anion gap: 10 (ref 5–15)
BUN: 11 mg/dL (ref 6–20)
CO2: 31 mmol/L (ref 22–32)
CREATININE: 0.68 mg/dL (ref 0.44–1.00)
Calcium: 9.3 mg/dL (ref 8.9–10.3)
Chloride: 98 mmol/L (ref 98–111)
GFR calc non Af Amer: >60 mL/min (ref >60)
Glucose, Bld: 110 mg/dL — ABNORMAL HIGH (ref 70–99)
Potassium: 3.9 mmol/L (ref 3.5–5.1)
Sodium: 139 mmol/L (ref 135–145)
Total Bilirubin: 0.6 mg/dL (ref 0.3–1.2)
Total Protein: 7.6 g/dL (ref 6.5–8.1)

## 2018-08-04 LAB — I-STAT CHEM 8, ED
BUN: 12 mg/dL (ref 6–20)
Calcium, Ion: 1.22 mmol/L (ref 1.15–1.40)
Chloride: 100 mmol/L (ref 98–111)
Creatinine, Ser: 0.7 mg/dL (ref 0.44–1.00)
Glucose, Bld: 110 mg/dL — ABNORMAL HIGH (ref 70–99)
HCT: 39 % (ref 36.0–46.0)
Hemoglobin: 13.3 g/dL (ref 12.0–15.0)
Potassium: 4 mmol/L (ref 3.5–5.1)
SODIUM: 140 mmol/L (ref 135–145)
TCO2: 33 mmol/L — ABNORMAL HIGH (ref 22–32)

## 2018-08-04 LAB — APTT: aPTT: 35 seconds (ref 24–36)

## 2018-08-04 LAB — CBC
HCT: 38.8 % (ref 36.0–46.0)
Hemoglobin: 11.6 g/dL — ABNORMAL LOW (ref 12.0–15.0)
MCH: 22.4 pg — ABNORMAL LOW (ref 26.0–34.0)
MCHC: 29.9 g/dL — ABNORMAL LOW (ref 30.0–36.0)
MCV: 75 fL — ABNORMAL LOW (ref 80.0–100.0)
Platelets: 368 10*3/uL (ref 150–400)
RBC: 5.17 MIL/uL — ABNORMAL HIGH (ref 3.87–5.11)
RDW: 16.3 % — AB (ref 11.5–15.5)
WBC: 7.1 10*3/uL (ref 4.0–10.5)
nRBC: 0 % (ref 0.0–0.2)

## 2018-08-04 LAB — I-STAT TROPONIN, ED: Troponin i, poc: 0.01 ng/mL (ref 0.00–0.08)

## 2018-08-04 LAB — PROTIME-INR
INR: 0.95
Prothrombin Time: 12.6 seconds (ref 11.4–15.2)

## 2018-08-04 LAB — ETHANOL: Alcohol, Ethyl (B): <10 mg/dL (ref <10)

## 2018-08-04 LAB — I-STAT BETA HCG BLOOD, ED (MC, WL, AP ONLY): I-stat hCG, quantitative: <5 m[IU]/mL (ref <5)

## 2018-08-04 MED ORDER — IOPAMIDOL (ISOVUE-370) INJECTION 76%
100.0000 mL | Freq: Once | INTRAVENOUS | Status: AC | PRN
  Administered 2018-08-05: 100 mL via INTRAVENOUS

## 2018-08-04 MED ORDER — IOPAMIDOL (ISOVUE-370) INJECTION 76%
INTRAVENOUS | Status: AC
  Filled 2018-08-04: qty 100

## 2018-08-04 NOTE — ED Triage Notes (Signed)
Pt arrived via [facility] from home after pt experienced a period of weakness in her right side at approx 1930hrs today. Per EMS, pt had no deficits at time of arrival. Pt has no c/o pain or deficits at time of triage. Pt is alert and oriented at this time. No weaknesses noted.

## 2018-08-04 NOTE — ED Notes (Signed)
ED Provider at bedside. 

## 2018-08-04 NOTE — ED Notes (Signed)
Patient aware that we need urine sample for testing, unable at this time. Pt given instruction on providing urine sample when able to do so.   

## 2018-08-04 NOTE — ED Provider Notes (Signed)
[facility] EMERGENCY DEPARTMENT Provider Note   CSN: [id number] Arrival date & time: 08/04/18  2032     History   Chief Complaint Chief Complaint  Patient presents with  . Transient Ischemic Attack    HPI Jessica Lowe is a 52 y.o. female.  52 yo F with a chief complaint of right-sided weakness.  This lasted for just a few minutes.  The patient states that she woke up from a nap and was unable to sit up.  She saw a coworker and she tried to talk to her for help but all that came out was gurgling noises.  Reportedly she had noted significant weakness to her right side.  By the time EMS arrived the patient had significant improvement and by the time she arrived here she had no deficits.  She states that she has not felt well today.  She denies recent illness denies cough congestion fever denies nausea vomiting or diarrhea.  Denies abdominal pain denies headache or neck pain.  The history is provided by the patient.  Illness  This is a new problem. The current episode started 2 days ago. The problem occurs constantly. The problem has not changed since onset.Pertinent negatives include no chest pain, no abdominal pain, no headaches and no shortness of breath. Nothing aggravates the symptoms. Nothing relieves the symptoms. She has tried nothing for the symptoms. The treatment provided no relief.    Past Medical History:  Diagnosis Date  . Anemia   . Diabetes mellitus without complication ([place])   . Hypertension     Patient Active Problem List   Diagnosis Date Noted  . FIBROIDS, UTERUS 03/16/2007  . 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 MORBID OBESITY 03/16/2007  . 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 HYPERTENSION 03/16/2007  . HYPERGLYCEMIA, FASTING 03/16/2007  . 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 KNEE ARTHROSCOPY Right 2008  . OVARIAN CYST REMOVAL Left 2001  . THYROID CYST EXCISION  1999     OB  History   None      Home Medications    Prior to Admission medications   Medication Sig Start Date End Date Taking? Authorizing Provider  amoxicillin (AMOXIL) 500 MG capsule Take 1 capsule (500 mg total) by mouth 3 (three) times daily. Patient not taking: Reported on 11/21/2017 02/18/15   Micheline Chapman, NP  aspirin 81 MG EC tablet Take 81 mg by mouth every evening. Swallow whole.     [provider]  hydrochlorothiazide (HYDRODIURIL) 25 MG tablet Take 25 mg by mouth daily. 11/19/17   [provider]  metFORMIN (GLUCOPHAGE) 500 MG tablet Take 1 tablet (500 mg total) by mouth daily with breakfast. 11/26/14   Lance Bosch, NP  pravastatin (PRAVACHOL) 20 MG tablet Take 20 mg by mouth every evening.  11/14/17   [provider]  telmisartan (MICARDIS) 40 MG tablet Take 40 mg by mouth daily. 11/19/17   [provider]  traMADol (ULTRAM) 50 MG tablet Take 1 tablet (50 mg total) by mouth every 8 (eight) hours as needed. Patient not taking: Reported on 11/21/2017 02/18/15   Micheline Chapman, NP  triamterene-hydrochlorothiazide (DYAZIDE) 37.5-25 MG per capsule Take 1 each (1 capsule total) by mouth daily. Patient not taking: Reported on 11/21/2017 11/26/14   Lance Bosch, NP    Family History Family History  Problem Relation Age of Onset  . Diabetes Mother   . Hypertension Mother   .  Prostatitis Father   . Hypertension Father     Social History Social History   Tobacco Use  . Smoking status: Never Smoker  . Smokeless tobacco: Never Used  Substance Use Topics  . Alcohol use: No  . Drug use: No     Allergies   Patient has no known allergies.   Review of Systems Review of Systems  Constitutional: Negative for chills and fever.  HENT: Negative for congestion and rhinorrhea.   Eyes: Negative for redness and visual disturbance.  Respiratory: Negative for shortness of breath and wheezing.   Cardiovascular: Negative for chest pain and  palpitations.  Gastrointestinal: Negative for abdominal pain, nausea and vomiting.  Genitourinary: Negative for dysuria and urgency.  Musculoskeletal: Negative for arthralgias and myalgias.  Skin: Negative for pallor and wound.  Neurological: Positive for weakness. Negative for dizziness and headaches.     Physical Exam Updated Vital Signs BP (!) 148/79 (BP Location: Right Arm)   Pulse 71   Temp 97.8 F (36.6 C) (Oral)   Resp (!) 23   LMP 03/22/2013   SpO2 100%   Physical Exam  Constitutional: She is oriented to person, place, and time. She appears well-developed and well-nourished. No distress.  HENT:  Head: Normocephalic and atraumatic.  Eyes: Pupils are equal, round, and reactive to light. EOM are normal.  Neck: Normal range of motion. Neck supple.  Cardiovascular: Normal rate and regular rhythm. Exam reveals no gallop and no friction rub.  No murmur heard. Pulmonary/Chest: Effort normal. She has no wheezes. She has no rales.  Abdominal: Soft. She exhibits no distension. There is no tenderness.  Musculoskeletal: She exhibits no edema or tenderness.  Neurological: She is alert and oriented to person, place, and time. She has normal strength. No cranial nerve deficit or sensory deficit. She displays a negative Romberg sign. Coordination and gait normal.  Benign neuro exam  Skin: Skin is warm and dry. She is not diaphoretic.  Psychiatric: She has a normal mood and affect. Her behavior is normal.  Nursing note and vitals reviewed.    ED Treatments / Results  Labs (all labs ordered are listed, but only abnormal results are displayed) Labs Reviewed  CBC - Abnormal; Notable for the following components:      Result Value   RBC 5.17 (*)    Hemoglobin 11.6 (*)    MCV 75.0 (*)    MCH 22.4 (*)    MCHC 29.9 (*)    RDW 16.3 (*)    All other components within normal limits  COMPREHENSIVE METABOLIC PANEL - Abnormal; Notable for the following components:   Glucose, Bld 110 (*)     Albumin 3.4 (*)    All other components within normal limits  I-STAT CHEM 8, ED - Abnormal; Notable for the following components:   Glucose, Bld 110 (*)    TCO2 33 (*)    All other components within normal limits  ETHANOL  PROTIME-INR  APTT  DIFFERENTIAL  RAPID URINE DRUG SCREEN, HOSP PERFORMED  URINALYSIS, ROUTINE W REFLEX MICROSCOPIC  I-STAT TROPONIN, ED  I-STAT BETA HCG BLOOD, ED ([facility], [facility], [facility] ONLY)    EKG EKG Interpretation  Date/Time:  Monday August 04 2018 21:21:41 EST Ventricular Rate:  66 PR Interval:    QRS Duration: 94 QT Interval:  393 QTC Calculation: 412 R Axis:   21 Text Interpretation:  Sinus rhythm Prolonged PR interval Baseline wander in lead(s) V6 No significant change since last tracing Confirmed by Deno Etienne [phone]) on  08/04/2018 9:28:44 PM   Radiology Ct Head Wo Contrast  Result Date: 08/04/2018 CLINICAL DATA:  Right-sided weakness. EXAM: CT HEAD WITHOUT CONTRAST TECHNIQUE: Contiguous axial images were obtained from the base of the skull through the vertex without intravenous contrast. COMPARISON:  CT scan of November 20, 2017. FINDINGS: Brain: No evidence of acute infarction, hemorrhage, hydrocephalus, extra-axial collection or mass lesion/mass effect. Vascular: No hyperdense vessel or unexpected calcification. Skull: Normal. Negative for fracture or focal lesion. Sinuses/Orbits: No acute finding. Other: None. IMPRESSION: Normal head CT. Electronically Signed   By: Marijo Conception, M.D.   On: 08/04/2018 21:24    Procedures Procedures (including critical care time)  Medications Ordered in ED Medications - No data to display   Initial Impression / Assessment and Plan / ED Course  I have reviewed the triage vital signs and the nursing notes.  Pertinent labs & imaging results that were available during my care of the patient were reviewed by me and considered in my medical decision making (see chart for details).     52 yo F with a cc transient  right-sided weakness.  Lasted only for a few minutes and then resolved.  Patient states that she does feels unwell.  Denies recent illness or fever.  Will obtain lab work head CT and discuss with neurology.  Discussed with Dr. Lorraine Lax, he felt that this was unlikely to be TIA, thought most likely was transient post sleep paralysis.  He did recommend a CT angiogram of the head and the neck.  At that point if that is unremarkable he felt she could be discharged with outpatient neurology follow-up.  Case was discussed with Dr. Betsey Holiday, please see his note for further details of care in the ED.  The patients results and plan were reviewed and discussed.   Any x-rays performed were independently reviewed by myself.   Differential diagnosis were considered with the presenting HPI.  Medications - No data to display  Vitals:   08/04/18 2117 08/04/18 2130 08/04/18 2344  BP: (!) 179/72 (!) 161/90 (!) 148/79  Pulse: (!) 7 69 71  Resp: 16 20 (!) 23  Temp: 97.8 F (36.6 C)    TempSrc: Oral    SpO2: 98% 100% 100%    Final diagnoses:  Right sided weakness       Final Clinical Impressions(s) / ED Diagnoses   Final diagnoses:  Right sided weakness    ED Discharge Orders         Ordered    Ambulatory referral to Neurology    Comments:  Right sided weakness spontaneously resolved <28min   08/04/18 [place], [place], DO 08/04/18 2355

## 2018-08-05 ENCOUNTER — Emergency Department (HOSPITAL_COMMUNITY): Payer: 59

## 2018-08-05 ENCOUNTER — Observation Stay (HOSPITAL_BASED_OUTPATIENT_CLINIC_OR_DEPARTMENT_OTHER): Payer: 59

## 2018-08-05 ENCOUNTER — Observation Stay (HOSPITAL_COMMUNITY): Payer: 59

## 2018-08-05 ENCOUNTER — Encounter (HOSPITAL_COMMUNITY): Payer: Self-pay | Admitting: Internal Medicine

## 2018-08-05 DIAGNOSIS — E119 Type 2 diabetes mellitus without complications: Secondary | ICD-10-CM

## 2018-08-05 DIAGNOSIS — G478 Other sleep disorders: Secondary | ICD-10-CM

## 2018-08-05 DIAGNOSIS — I10 Essential (primary) hypertension: Secondary | ICD-10-CM | POA: Diagnosis not present

## 2018-08-05 DIAGNOSIS — E785 Hyperlipidemia, unspecified: Secondary | ICD-10-CM | POA: Diagnosis present

## 2018-08-05 DIAGNOSIS — I7771 Dissection of carotid artery: Secondary | ICD-10-CM | POA: Diagnosis present

## 2018-08-05 DIAGNOSIS — G459 Transient cerebral ischemic attack, unspecified: Secondary | ICD-10-CM

## 2018-08-05 DIAGNOSIS — D509 Iron deficiency anemia, unspecified: Secondary | ICD-10-CM

## 2018-08-05 LAB — GLUCOSE, CAPILLARY
GLUCOSE-CAPILLARY: 121 mg/dL — AB (ref 70–99)
Glucose-Capillary: 113 mg/dL — ABNORMAL HIGH (ref 70–99)
Glucose-Capillary: 111 mg/dL — ABNORMAL HIGH (ref 70–99)

## 2018-08-05 LAB — HEMOGLOBIN A1C
Hgb A1c MFr Bld: 6.6 % — ABNORMAL HIGH (ref 4.8–5.6)
Mean Plasma Glucose: 142.72 mg/dL

## 2018-08-05 LAB — LIPID PANEL
CHOL/HDL RATIO: 2.6 ratio
Cholesterol: 157 mg/dL (ref 0–200)
HDL: 61 mg/dL (ref >40)
LDL Cholesterol: 87 mg/dL (ref 0–99)
TRIGLYCERIDES: 47 mg/dL (ref <150)
VLDL: 9 mg/dL (ref 0–40)

## 2018-08-05 LAB — URINALYSIS, ROUTINE W REFLEX MICROSCOPIC
Bilirubin Urine: NEGATIVE
Glucose, UA: NEGATIVE mg/dL
Hgb urine dipstick: NEGATIVE
Ketones, ur: NEGATIVE mg/dL
Leukocytes, UA: NEGATIVE
Nitrite: NEGATIVE
Protein, ur: NEGATIVE mg/dL
Specific Gravity, Urine: 1.035 — ABNORMAL HIGH (ref 1.005–1.030)
pH: 5 (ref 5.0–8.0)

## 2018-08-05 LAB — CBC
HCT: 38.7 % (ref 36.0–46.0)
Hemoglobin: 11.9 g/dL — ABNORMAL LOW (ref 12.0–15.0)
MCH: 22.8 pg — AB (ref 26.0–34.0)
MCHC: 30.7 g/dL (ref 30.0–36.0)
MCV: 74 fL — ABNORMAL LOW (ref 80.0–100.0)
Platelets: 335 10*3/uL (ref 150–400)
RBC: 5.23 MIL/uL — ABNORMAL HIGH (ref 3.87–5.11)
RDW: 16 % — ABNORMAL HIGH (ref 11.5–15.5)
WBC: 6.9 10*3/uL (ref 4.0–10.5)
nRBC: 0 % (ref 0.0–0.2)

## 2018-08-05 LAB — RAPID URINE DRUG SCREEN, HOSP PERFORMED
Amphetamines: NOT DETECTED
Barbiturates: NOT DETECTED
Benzodiazepines: NOT DETECTED
Cocaine: NOT DETECTED
Opiates: NOT DETECTED
TETRAHYDROCANNABINOL: NOT DETECTED

## 2018-08-05 LAB — CBG MONITORING, ED: Glucose-Capillary: 113 mg/dL — ABNORMAL HIGH (ref 70–99)

## 2018-08-05 LAB — HIV ANTIBODY (ROUTINE TESTING W REFLEX): HIV Screen 4th Generation wRfx: NONREACTIVE

## 2018-08-05 LAB — ECHOCARDIOGRAM COMPLETE

## 2018-08-05 MED ORDER — ATORVASTATIN CALCIUM 80 MG PO TABS: 80.0000 mg | ORAL_TABLET | Freq: Every day | ORAL | Status: DC

## 2018-08-05 MED ORDER — HYDROCHLOROTHIAZIDE 25 MG PO TABS
25.0000 mg | ORAL_TABLET | Freq: Every day | ORAL | Status: DC
  Administered 2018-08-05 – 2018-08-06 (×2): 25 mg via ORAL
  Filled 2018-08-05 (×2): qty 1

## 2018-08-05 MED ORDER — INSULIN ASPART 100 UNIT/ML ~~LOC~~ SOLN: 0.0000 [IU] | Freq: Every day | SUBCUTANEOUS | Status: DC

## 2018-08-05 MED ORDER — ASPIRIN 81 MG PO CHEW
324.0000 mg | CHEWABLE_TABLET | Freq: Once | ORAL | Status: AC
  Administered 2018-08-05: 324 mg via ORAL
  Filled 2018-08-05: qty 4

## 2018-08-05 MED ORDER — ATORVASTATIN CALCIUM 10 MG PO TABS
20.0000 mg | ORAL_TABLET | Freq: Every day | ORAL | Status: DC
  Administered 2018-08-05: 20 mg via ORAL
  Filled 2018-08-05: qty 2

## 2018-08-05 MED ORDER — ASPIRIN 81 MG PO CHEW: 324.0000 mg | CHEWABLE_TABLET | Freq: Every day | ORAL | Status: DC

## 2018-08-05 MED ORDER — STROKE: EARLY STAGES OF RECOVERY BOOK
Freq: Once | Status: DC
  Filled 2018-08-05: qty 1

## 2018-08-05 MED ORDER — ENOXAPARIN SODIUM 40 MG/0.4ML ~~LOC~~ SOLN
40.0000 mg | SUBCUTANEOUS | Status: DC
  Administered 2018-08-05: 40 mg via SUBCUTANEOUS
  Filled 2018-08-05 (×2): qty 0.4

## 2018-08-05 MED ORDER — CLOPIDOGREL BISULFATE 75 MG PO TABS
75.0000 mg | ORAL_TABLET | Freq: Every day | ORAL | Status: DC
  Administered 2018-08-05 – 2018-08-06 (×2): 75 mg via ORAL
  Filled 2018-08-05 (×2): qty 1

## 2018-08-05 MED ORDER — AMLODIPINE BESYLATE 5 MG PO TABS
5.0000 mg | ORAL_TABLET | Freq: Two times a day (BID) | ORAL | Status: DC
  Administered 2018-08-05 – 2018-08-06 (×3): 5 mg via ORAL
  Filled 2018-08-05 (×3): qty 1

## 2018-08-05 MED ORDER — ACETAMINOPHEN 650 MG RE SUPP: 650.0000 mg | RECTAL | Status: DC | PRN

## 2018-08-05 MED ORDER — HYDRALAZINE HCL 20 MG/ML IJ SOLN: 10.0000 mg | INTRAMUSCULAR | Status: DC | PRN

## 2018-08-05 MED ORDER — LOSARTAN POTASSIUM 50 MG PO TABS
100.0000 mg | ORAL_TABLET | Freq: Every day | ORAL | Status: DC
  Administered 2018-08-05 – 2018-08-06 (×2): 100 mg via ORAL
  Filled 2018-08-05 (×2): qty 2

## 2018-08-05 MED ORDER — ACETAMINOPHEN 160 MG/5ML PO SOLN: 650.0000 mg | ORAL | Status: DC | PRN

## 2018-08-05 MED ORDER — ASPIRIN 81 MG PO CHEW
81.0000 mg | CHEWABLE_TABLET | Freq: Every day | ORAL | Status: DC
  Administered 2018-08-06: 81 mg via ORAL
  Filled 2018-08-05: qty 1

## 2018-08-05 MED ORDER — INSULIN ASPART 100 UNIT/ML ~~LOC~~ SOLN: 0.0000 [IU] | Freq: Three times a day (TID) | SUBCUTANEOUS | Status: DC

## 2018-08-05 MED ORDER — ACETAMINOPHEN 325 MG PO TABS: 650.0000 mg | ORAL_TABLET | ORAL | Status: DC | PRN

## 2018-08-05 MED ORDER — PERFLUTREN LIPID MICROSPHERE
1.0000 mL | INTRAVENOUS | Status: AC | PRN
  Administered 2018-08-05: 3 mL via INTRAVENOUS
  Filled 2018-08-05: qty 20
  Filled 2018-08-05: qty 10

## 2018-08-05 MED ORDER — SENNOSIDES-DOCUSATE SODIUM 8.6-50 MG PO TABS: 1.0000 | ORAL_TABLET | Freq: Every evening | ORAL | Status: DC | PRN

## 2018-08-05 MED ORDER — PRAVASTATIN SODIUM 10 MG PO TABS: 20.0000 mg | ORAL_TABLET | Freq: Every evening | ORAL | Status: DC

## 2018-08-05 NOTE — Progress Notes (Signed)
STROKE TEAM PROGRESS NOTE   SUBJECTIVE (INTERVAL HISTORY) Jessica Lowe husband is at the bedside.  Overall Jessica Lowe condition is completely resolved. Jessica Lowe stated that last night Jessica Lowe fell to sleep at work and Jessica Lowe co-worker tried to wake Jessica Lowe up, and Jessica Lowe had some slurry speech and not able to move right side body on waking up. Once Jessica Lowe fully woke up, the symptoms resolved.    OBJECTIVE Temp:  [97.5 F (36.4 C)-97.8 F (36.6 C)] 97.6 F (36.4 C) (12/03 1126) Pulse Rate:  [7-73] 71 (12/03 1126) Cardiac Rhythm: Heart block (12/03 0927) Resp:  [12-23] 12 (12/03 1126) BP: (128-179)/(70-93) 148/83 (12/03 1126) SpO2:  [98 %-100 %] 100 % (12/03 1126)  Recent Labs  Lab 08/05/18 0730 08/05/18 1127  GLUCAP 113* 113*   Recent Labs  Lab 08/04/18 2138 08/04/18 2148  NA 139 140  K 3.9 4.0  CL 98 100  CO2 31  --   GLUCOSE 110* 110*  BUN 11 12  CREATININE 0.68 0.70  CALCIUM 9.3  --    Recent Labs  Lab 08/04/18 2138  AST 16  ALT 14  ALKPHOS 56  BILITOT 0.6  PROT 7.6  ALBUMIN 3.4*   Recent Labs  Lab 08/04/18 2138 08/04/18 2148 08/05/18 0308  WBC 7.1  --  6.9  NEUTROABS 3.5  --   --   HGB 11.6* 13.3 11.9*  HCT 38.8 39.0 38.7  MCV 75.0*  --  74.0*  PLT 368  --  335   No results for input(s): CKTOTAL, CKMB, CKMBINDEX, TROPONINI in the last 168 hours. Recent Labs    08/04/18 2138  LABPROT 12.6  INR 0.95   Recent Labs    08/05/18 0056  COLORURINE YELLOW  LABSPEC 1.035*  PHURINE 5.0  GLUCOSEU NEGATIVE  HGBUR NEGATIVE  BILIRUBINUR NEGATIVE  KETONESUR NEGATIVE  PROTEINUR NEGATIVE  NITRITE NEGATIVE  LEUKOCYTESUR NEGATIVE       Component Value Date/Time   CHOL 157 08/05/2018 0308   TRIG 47 08/05/2018 0308   HDL 61 08/05/2018 0308   CHOLHDL 2.6 08/05/2018 0308   VLDL 9 08/05/2018 0308   LDLCALC 87 08/05/2018 0308   Lab Results  Component Value Date   HGBA1C 6.6 (H) 08/05/2018      Component Value Date/Time   LABOPIA NONE DETECTED 08/05/2018 0056   COCAINSCRNUR NONE  DETECTED 08/05/2018 0056   LABBENZ NONE DETECTED 08/05/2018 0056   AMPHETMU NONE DETECTED 08/05/2018 0056   THCU NONE DETECTED 08/05/2018 0056   LABBARB NONE DETECTED 08/05/2018 0056    Recent Labs  Lab 08/04/18 2138  ETH <10    I have personally reviewed the radiological images below and agree with the radiology interpretations.  Ct Angio Head W Or Wo Contrast  Result Date: 08/05/2018 CLINICAL DATA:  Acute onset RIGHT-sided weakness and garbled speech. Persistent dizziness. History of hypertension and diabetes. EXAM: CT ANGIOGRAPHY HEAD AND NECK TECHNIQUE: Multidetector CT imaging of the head and neck was performed using the standard protocol during bolus administration of intravenous contrast. Multiplanar CT image reconstructions and MIPs were obtained to evaluate the vascular anatomy. Carotid stenosis measurements (when applicable) are obtained utilizing NASCET criteria, using the distal internal carotid diameter as the denominator. CONTRAST:  138mL ISOVUE-370 IOPAMIDOL (ISOVUE-370) INJECTION 76% COMPARISON:  None. FINDINGS: CTA NECK FINDINGS AORTIC ARCH: Normal appearance of the thoracic arch. The origins of the innominate, left Common carotid artery and subclavian artery are widely patent. RIGHT CAROTID SYSTEM: Common carotid artery is widely patent, coursing in a straight  line fashion. Normal appearance of the carotid bifurcation without hemodynamically significant stenosis by NASCET criteria. Tortuous RIGHT ICA with multiple folds, no dissection. LEFT CAROTID SYSTEM: Common carotid artery is widely patent, coursing in a straight line fashion. Normal appearance of the carotid bifurcation without hemodynamically significant stenosis by NASCET criteria. Nonocclusive intimal flap mid cervical ICA with 4 mm focally ectatic LEFT ICA, probable pseudoaneurysm. VERTEBRAL ARTERIES:Left vertebral artery is dominant, rising directly from the aortic arch. Moderate luminal irregularity RIGHT vertebral  artery through the distal RIGHT V2 segment. SKELETON: No acute osseous process though bone windows have not been submitted. LEFT mandible molar periapical abscess. OTHER NECK: Soft tissues of the neck are nonacute though, not tailored for evaluation. Status post LEFT thyroidectomy. UPPER CHEST: Included lung apices are clear. No superior mediastinal lymphadenopathy. DELAYED PHASE: No abnormal intracranial enhancement. CTA HEAD FINDINGS-delayed phase. Due to technical difficulties, CTA HEAD not included on original CTA and subsequent imaging resulted in delayed essentially non diagnostic phase. ANTERIOR CIRCULATION: Patent internal carotid arteries though limited assessment. Patent carotid terminus and proximal M1 segments. Limited assessment of ACA. POSTERIOR CIRCULATION: Limited assessment of vertebral arteries. Basilar artery and proximal PCAs are patent. VENOUS SINUSES: Major dural venous sinuses are patent though not tailored for evaluation on this angiographic examination. ANATOMIC VARIANTS: Limited assessment. DELAYED PHASE: No abnormal intracranial enhancement. MIP images reviewed. IMPRESSION: CTA NECK: 1. 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 2. 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 Patent vertebral arteries. CTA HEAD: 1. Delayed phase, essentially nondiagnostic. No definite emergent large vessel occlusion. Acute findings discussed with and reconfirmed by Dr.Pollina on 08/05/2018 at 12:30 am. Electronically Signed   By: Elon Alas M.D.   On: 08/05/2018 01:06   Ct Head Wo Contrast  Result Date: 08/04/2018 CLINICAL DATA:  Right-sided weakness. EXAM: CT HEAD WITHOUT CONTRAST TECHNIQUE: Contiguous axial images were obtained from the base of the skull through the vertex without intravenous contrast. COMPARISON:  CT scan of November 20, 2017. FINDINGS: Brain: No evidence of acute infarction, hemorrhage, hydrocephalus,  extra-axial collection or mass lesion/mass effect. Vascular: No hyperdense vessel or unexpected calcification. Skull: Normal. Negative for fracture or focal lesion. Sinuses/Orbits: No acute finding. Other: None. IMPRESSION: Normal head CT. Electronically Signed   By: Marijo Conception, M.D.   On: 08/04/2018 21:24   Ct Angio Neck W And/or Wo Contrast  Result Date: 08/05/2018 CLINICAL DATA:  Acute onset RIGHT-sided weakness and garbled speech. Persistent dizziness. History of hypertension and diabetes. EXAM: CT ANGIOGRAPHY HEAD AND NECK TECHNIQUE: Multidetector CT imaging of the head and neck was performed using the standard protocol during bolus administration of intravenous contrast. Multiplanar CT image reconstructions and MIPs were obtained to evaluate the vascular anatomy. Carotid stenosis measurements (when applicable) are obtained utilizing NASCET criteria, using the distal internal carotid diameter as the denominator. CONTRAST:  147mL ISOVUE-370 IOPAMIDOL (ISOVUE-370) INJECTION 76% COMPARISON:  None. FINDINGS: CTA NECK FINDINGS AORTIC ARCH: Normal appearance of the thoracic arch. The origins of the innominate, left Common carotid artery and subclavian artery are widely patent. RIGHT CAROTID SYSTEM: Common carotid artery is widely patent, coursing in a straight line fashion. Normal appearance of the carotid bifurcation without hemodynamically significant stenosis by NASCET criteria. Tortuous RIGHT ICA with multiple folds, no dissection. LEFT CAROTID SYSTEM: Common carotid artery is widely patent, coursing in a straight line fashion. Normal appearance of the carotid bifurcation without hemodynamically significant stenosis by NASCET criteria. Nonocclusive intimal flap mid cervical ICA with  4 mm focally ectatic LEFT ICA, probable pseudoaneurysm. VERTEBRAL ARTERIES:Left vertebral artery is dominant, rising directly from the aortic arch. Moderate luminal irregularity RIGHT vertebral artery through the distal RIGHT  V2 segment. SKELETON: No acute osseous process though bone windows have not been submitted. LEFT mandible molar periapical abscess. OTHER NECK: Soft tissues of the neck are nonacute though, not tailored for evaluation. Status post LEFT thyroidectomy. UPPER CHEST: Included lung apices are clear. No superior mediastinal lymphadenopathy. DELAYED PHASE: No abnormal intracranial enhancement. CTA HEAD FINDINGS-delayed phase. Due to technical difficulties, CTA HEAD not included on original CTA and subsequent imaging resulted in delayed essentially non diagnostic phase. ANTERIOR CIRCULATION: Patent internal carotid arteries though limited assessment. Patent carotid terminus and proximal M1 segments. Limited assessment of ACA. POSTERIOR CIRCULATION: Limited assessment of vertebral arteries. Basilar artery and proximal PCAs are patent. VENOUS SINUSES: Major dural venous sinuses are patent though not tailored for evaluation on this angiographic examination. ANATOMIC VARIANTS: Limited assessment. DELAYED PHASE: No abnormal intracranial enhancement. MIP images reviewed. IMPRESSION: CTA NECK: 1. 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 2. 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 Patent vertebral arteries. CTA HEAD: 1. Delayed phase, essentially nondiagnostic. No definite emergent large vessel occlusion. Acute findings discussed with and reconfirmed by Dr.Pollina on 08/05/2018 at 12:30 am. Electronically Signed   By: Elon Alas M.D.   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 EXAM: MRI HEAD WITHOUT CONTRAST MRA HEAD WITHOUT CONTRAST TECHNIQUE: Multiplanar, multiecho pulse sequences of the brain and surrounding structures were  obtained without intravenous contrast. Angiographic images of the head were obtained using MRA technique without contrast. COMPARISON:  Intracranial MRA 0158 hours today. CTA head and neck 0015 hours. Head CT 2109 hours yesterday. FINDINGS: MRI HEAD FINDINGS Brain: No restricted diffusion or evidence of acute infarction. No cortical encephalomalacia or chronic cerebral blood products. Scattered small bilateral cerebral white matter T2 and FLAIR hyperintense foci, mostly subcortical and moderate for age. The deep gray matter nuclei, brainstem, and cerebellum appear normal. No midline shift, mass effect, evidence of mass lesion, ventriculomegaly, extra-axial collection or acute intracranial hemorrhage. Cervicomedullary junction and pituitary are within normal limits. Vascular: Major intracranial vascular flow voids are preserved, and the distal cervical left ICA flow void has a normal appearance on T1, T2, and FLAIR. Skull and upper cervical spine: Negative visible cervical spine. Visualized bone marrow signal is within normal limits. Sinuses/Orbits: Negative orbits. Paranasal Visualized paranasal sinuses and mastoids are stable and well pneumatized. Other: Visible internal auditory structures appear normal. Scalp and face soft tissues appear negative. MRA HEAD FINDINGS Stable antegrade flow in the posterior circulation with mildly dominant left vertebral artery. Normal distal vertebral arteries, PICA origins and vertebrobasilar junction. Patent basilar artery without stenosis. Patent AICA and SCA origins. Patent and normal PCA origins. Posterior communicating arteries are diminutive or absent. Normal bilateral PCA branches. Stable and symmetric antegrade flow in both ICA siphons. More of the cervical ICAs are included on these images than earlier today. The left ICA at the C2 level has a somewhat flattened and beaded morphology on series 12, image 13. However on MIP images this more resembles fibromuscular dysplasia  than carotid dissection (series 1115, image 3). Mildly beaded appearance of the contralateral right cervical ICA. No ICA siphon stenosis.  Ophthalmic artery origins appear within normal limits. Patent carotid termini. Normal MCA and ACA origins. The MCA bifurcations appear stable and patent. No convincing MCA bifurcation stenosis. Visible bilateral MCA branches are within normal limits. The right ACA A1 segment is mildly dominant. The anterior communicating artery and visible ACA branches are within normal limits. IMPRESSION: 1. Negative for acute infarct or other acute intracranial abnormality. 2. 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 Otherwise negative intracranial MRA. 3. Moderate for age nonspecific cerebral white matter signal changes. Electronically Signed   By: Genevie Ann M.D.   On: 08/05/2018 13:13   Mr Jodene Nam Head Wo Contrast  Result Date: 08/05/2018 CLINICAL DATA:  Follow up LEFT carotid artery dissection. RIGHT-sided weakness. EXAM: MRA HEAD WITHOUT CONTRAST TECHNIQUE: Angiographic images of the Circle of Willis were obtained using MRA technique without intravenous contrast. COMPARISON:  CT angiogram head August 05, 2018 FINDINGS: ANTERIOR CIRCULATION: Normal flow related enhancement of the included cervical, petrous, cavernous and supraclinoid internal carotid arteries. Patent anterior communicating artery. Patent anterior and middle cerebral arteries. Moderate stenosis LEFT M2 origin. No large vessel occlusion, aneurysm. POSTERIOR CIRCULATION: vertebral artery is dominant. Vertebrobasilar arteries are patent, with normal flow related enhancement of the main branch vessels. Patent posterior cerebral arteries. No large vessel occlusion, flow limiting stenosis,  aneurysm. ANATOMIC VARIANTS: None. Source images  and MIP images were reviewed. IMPRESSION: 1. No emergent large vessel occlusion. 2. Moderate stenosis LEFT M2 origin. Electronically Signed   By: Elon Alas M.D.   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 EXAM: MRI HEAD WITHOUT CONTRAST MRA HEAD WITHOUT CONTRAST TECHNIQUE: Multiplanar, multiecho pulse sequences of the brain and surrounding structures were obtained without intravenous contrast. Angiographic images of the head were obtained using MRA technique without contrast. COMPARISON:  Intracranial MRA 0158 hours today. CTA head and neck 0015 hours. Head CT 2109 hours yesterday. FINDINGS: MRI HEAD FINDINGS Brain: No restricted diffusion or evidence of acute infarction. No cortical encephalomalacia or chronic cerebral blood products. Scattered small bilateral cerebral white matter T2 and FLAIR hyperintense foci, mostly subcortical and moderate for age. The deep gray matter nuclei, brainstem, and cerebellum appear normal. No midline shift, mass effect, evidence of mass lesion, ventriculomegaly, extra-axial collection or acute intracranial hemorrhage. Cervicomedullary junction and pituitary are within normal limits. Vascular: Major intracranial vascular flow voids are preserved, and the distal cervical left ICA flow void has a normal appearance on T1, T2, and FLAIR. Skull and upper cervical spine: Negative visible cervical spine. Visualized bone marrow signal is within normal limits. Sinuses/Orbits: Negative orbits. Paranasal Visualized paranasal sinuses and mastoids are stable and well pneumatized. Other: Visible internal auditory structures appear normal. Scalp and face soft tissues appear negative. MRA HEAD FINDINGS Stable antegrade flow in the posterior circulation with mildly dominant left  vertebral artery. Normal distal vertebral arteries, PICA origins and vertebrobasilar junction. Patent basilar artery without stenosis. Patent AICA and SCA origins. Patent and normal PCA origins. Posterior communicating arteries are diminutive or absent. Normal bilateral PCA branches. Stable and symmetric antegrade flow in both ICA siphons. More of the cervical ICAs are included on these images than earlier today. The  left ICA at the C2 level has a somewhat flattened and beaded morphology on series 12, image 13. However on MIP images this more resembles fibromuscular dysplasia than carotid dissection (series 1115, image 3). Mildly beaded appearance of the contralateral right cervical ICA. No ICA siphon stenosis. Ophthalmic artery origins appear within normal limits. Patent carotid termini. Normal MCA and ACA origins. The MCA bifurcations appear stable and patent. No convincing MCA bifurcation stenosis. Visible bilateral MCA branches are within normal limits. The right ACA A1 segment is mildly dominant. The anterior communicating artery and visible ACA branches are within normal limits. IMPRESSION: 1. Negative for acute infarct or other acute intracranial abnormality. 2. 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 Otherwise negative intracranial MRA. 3. Moderate for age nonspecific cerebral white matter signal changes. Electronically Signed   By: Genevie Ann M.D.   On: 08/05/2018 13:13    TTE pending   PHYSICAL EXAM  Temp:  [97.5 F (36.4 C)-97.8 F (36.6 C)] 97.6 F (36.4 C) (12/03 1126) Pulse Rate:  [7-73] 71 (12/03 1126) Resp:  [12-23] 12 (12/03 1126) BP: (128-179)/(70-93) 148/83 (12/03 1126) SpO2:  [98 %-100 %] 100 % (12/03 1126)  General - obese, well developed, in no apparent distress.  Ophthalmologic -  fundi not visualized due to noncooperation.  Cardiovascular - Regular rate and rhythm.  Mental Status -  Level of arousal and orientation to time, place, and person were intact. Language including expression, naming, repetition, comprehension was assessed and found intact. Attention span and concentration were normal. Fund of Knowledge was assessed and was intact.  Cranial Nerves II - XII - II - Visual field intact OU. III, IV, VI - Extraocular movements intact. V - Facial sensation intact bilaterally. VII - Facial movement intact bilaterally. VIII - Hearing & vestibular intact bilaterally. X - Palate elevates symmetrically. XI - Chin turning & shoulder shrug intact bilaterally. XII - Tongue protrusion intact.  Motor Strength - The patient's strength was normal in all extremities and pronator drift was absent.  Bulk was normal and fasciculations were absent.   Motor Tone - Muscle tone was assessed at the neck and appendages and was normal.  Reflexes - The patient's reflexes were symmetrical in all extremities and Jessica Lowe had no pathological reflexes.  Sensory - Light touch, temperature/pinprick were assessed and were symmetrical.    Coordination - The patient had normal movements in the hands and feet with no ataxia or dysmetria.  Tremor was absent.  Gait and Station - deferred.   ASSESSMENT/PLAN Ms. Grant Henkes is a 52 y.o. female with history of HTN and DM admitted for slurry speech and right sided numbness weakness on waking up from sleep. No tPA given due to back to baseline.    TIA vs. 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 Recommend DAPT for 3 weeks  and then either ASA or plavix alnone.  Patient counseled to be compliant with Jessica Lowe antithrombotic medications  Ongoing aggressive stroke risk factor management  Therapy recommendations:  Pending   Disposition:  Pending   Diabetes  HgbA1c 6.6 goal < 7.0  Controlled  CBG monitoring  SSI  close PCP follow up  Hypertension . Stable on the high end  BP goal normotensive  On home BP meds now  Hyperlipidemia  Home meds:  Pravastatin 20   LDL 87, goal < 70  Now on lipitor 20  Continue statin at discharge  Other Stroke Risk Factors  Obesity   Other [facility] day # 0  Neurology will sign off. Please call with questions. Pt will follow up with stroke clinic NP at [facility] in about 4 weeks. Thanks for the consult.   Rosalin Hawking, MD PhD Stroke Neurology 08/05/2018 2:55 PM    To contact Stroke Continuity provider, please refer to [email or web address] After hours, contact General Neurology

## 2018-08-05 NOTE — Consult Note (Signed)
Requesting Physician: Dr. Betsey Holiday    Chief Complaint:  right side numbness/weakness   History obtained from: Patient and Chart     HPI:                                                                                                                                       Jessica Lowe is an 52 y.o. female with past medical history of hypertension, diabetes mellitus presents with transient right-sided numbness and weakness.  She was at work when she took a nap.  Upon waking up she was unable to sit up and try to talk but was making gurgling noises.  She noted weakness in her right upper and lower extremities and EMS was called.  Time EMS arrived patient had returned to her normal self.  She was evaluated in the ED and underwent CT head which was negative.  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  Any history of neck trauma, chiropractic manipulation.  She does state that she stretches her neck  On occasions.  Only denies any neck pain.  Date last known well: 08/14/2018 tPA Given: no,symptoms resolved  NIHSS: 0 Baseline MRS 0     Past Medical History:  Diagnosis Date  . Anemia   . Diabetes mellitus without complication ([place])   . Hypertension     Past Surgical History:  Procedure Laterality Date  . KNEE ARTHROSCOPY Right 2008  . OVARIAN CYST REMOVAL Left 2001  . THYROID CYST EXCISION  1999    Family History  Problem Relation Age of Onset  . Diabetes Mother   . Hypertension Mother   . Prostatitis Father   . Hypertension Father    Social History:  reports that she has never smoked. She has never used smokeless tobacco. She reports that she does not drink alcohol or use drugs.  Allergies: No Known Allergies  Medications:                                                                                                                        I reviewed home medications   ROS:  14 systems reviewed and negative except above    Examination:                                                                                                      General: Appears well-developed . Psych: Affect appropriate to situation Eyes: No scleral injection HENT: No OP obstrucion Head: Normocephalic.  Cardiovascular: Normal rate and regular rhythm.  Respiratory: Effort normal and breath sounds normal to anterior ascultation GI: Soft.  No distension. There is no tenderness.  Skin: WDI    Neurological Examination Mental Status: Alert, oriented, thought content appropriate.  Speech fluent without evidence of aphasia. Able to follow 3 step commands without difficulty. Cranial Nerves: II: Visual fields grossly normal,  III,IV, VI: ptosis not present, extra-ocular motions intact bilaterally, pupils equal, round, reactive to light and accommodation V,VII: smile symmetric, facial light touch sensation normal bilaterally VIII: hearing normal bilaterally IX,X: uvula rises symmetrically XI: bilateral shoulder shrug XII: midline tongue extension Motor: Right : Upper extremity   5/5    Left:     Upper extremity   5/5  Lower extremity   5/5     Lower extremity   5/5 Tone and bulk:normal tone throughout; no atrophy noted Sensory: Pinprick and light touch intact throughout, bilaterally Deep Tendon Reflexes: 2+ and symmetric throughout Plantars: Right: downgoing   Left: downgoing Cerebellar: normal finger-to-nose, normal rapid alternating movements and normal heel-to-shin test Gait: normal gait and station     Lab Results: Basic Metabolic Panel: Recent Labs  Lab 08/04/18 12-19-36 08/04/18 2148  NA 139 140  K 3.9 4.0  CL 98 100  CO2 31  --   GLUCOSE 110* 110*  BUN 11 12  CREATININE 0.68 0.70  CALCIUM 9.3  --     CBC: Recent Labs  Lab 08/04/18 12-19-36 08/04/18 December 20, 2146 08/05/18 0308  WBC 7.1  --  6.9   NEUTROABS 3.5  --   --   HGB 11.6* 13.3 11.9*  HCT 38.8 39.0 38.7  MCV 75.0*  --  74.0*  PLT 368  --  335    Coagulation Studies: Recent Labs    08/04/18 Dec 19, 2136  LABPROT 12.6  INR 0.95    Imaging: Ct Angio Head W Or Wo Contrast  Result Date: 08/05/2018 CLINICAL DATA:  Acute onset RIGHT-sided weakness and garbled speech. Persistent dizziness. History of hypertension and diabetes. EXAM: CT ANGIOGRAPHY HEAD AND NECK TECHNIQUE: Multidetector CT imaging of the head and neck was performed using the standard protocol during bolus administration of intravenous contrast. Multiplanar CT image reconstructions and MIPs were obtained to evaluate the vascular anatomy. Carotid stenosis measurements (when applicable) are obtained utilizing NASCET criteria, using the distal internal carotid diameter as the denominator. CONTRAST:  143mL ISOVUE-370 IOPAMIDOL (ISOVUE-370) INJECTION 76% COMPARISON:  None. FINDINGS: CTA NECK FINDINGS AORTIC ARCH: Normal appearance of the thoracic arch. The origins of the innominate, left Common carotid artery and subclavian artery are widely patent. RIGHT CAROTID SYSTEM: Common carotid artery is widely patent, coursing in a straight line fashion. Normal appearance of the carotid bifurcation without hemodynamically significant stenosis  by NASCET criteria. Tortuous RIGHT ICA with multiple folds, no dissection. LEFT CAROTID SYSTEM: Common carotid artery is widely patent, coursing in a straight line fashion. Normal appearance of the carotid bifurcation without hemodynamically significant stenosis by NASCET criteria. Nonocclusive intimal flap mid cervical ICA with 4 mm focally ectatic LEFT ICA, probable pseudoaneurysm. VERTEBRAL ARTERIES:Left vertebral artery is dominant, rising directly from the aortic arch. Moderate luminal irregularity RIGHT vertebral artery through the distal RIGHT V2 segment. SKELETON: No acute osseous process though bone windows have not been submitted. LEFT mandible  molar periapical abscess. OTHER NECK: Soft tissues of the neck are nonacute though, not tailored for evaluation. Status post LEFT thyroidectomy. UPPER CHEST: Included lung apices are clear. No superior mediastinal lymphadenopathy. DELAYED PHASE: No abnormal intracranial enhancement. CTA HEAD FINDINGS-delayed phase. Due to technical difficulties, CTA HEAD not included on original CTA and subsequent imaging resulted in delayed essentially non diagnostic phase. ANTERIOR CIRCULATION: Patent internal carotid arteries though limited assessment. Patent carotid terminus and proximal M1 segments. Limited assessment of ACA. POSTERIOR CIRCULATION: Limited assessment of vertebral arteries. Basilar artery and proximal PCAs are patent. VENOUS SINUSES: Major dural venous sinuses are patent though not tailored for evaluation on this angiographic examination. ANATOMIC VARIANTS: Limited assessment. DELAYED PHASE: No abnormal intracranial enhancement. MIP images reviewed. IMPRESSION: CTA NECK: 1. 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 2. 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 Patent vertebral arteries. CTA HEAD: 1. Delayed phase, essentially nondiagnostic. No definite emergent large vessel occlusion. Acute findings discussed with and reconfirmed by Dr.Pollina on 08/05/2018 at 12:30 am. Electronically Signed   By: Elon Alas M.D.   On: 08/05/2018 01:06   Ct Head Wo Contrast  Result Date: 08/04/2018 CLINICAL DATA:  Right-sided weakness. EXAM: CT HEAD WITHOUT CONTRAST TECHNIQUE: Contiguous axial images were obtained from the base of the skull through the vertex without intravenous contrast. COMPARISON:  CT scan of November 20, 2017. FINDINGS: Brain: No evidence of acute infarction, hemorrhage, hydrocephalus, extra-axial collection or mass lesion/mass effect. Vascular: No hyperdense vessel or unexpected calcification. Skull: Normal. Negative for  fracture or focal lesion. Sinuses/Orbits: No acute finding. Other: None. IMPRESSION: Normal head CT. Electronically Signed   By: Marijo Conception, M.D.   On: 08/04/2018 21:24   Ct Angio Neck W And/or Wo Contrast  Result Date: 08/05/2018 CLINICAL DATA:  Acute onset RIGHT-sided weakness and garbled speech. Persistent dizziness. History of hypertension and diabetes. EXAM: CT ANGIOGRAPHY HEAD AND NECK TECHNIQUE: Multidetector CT imaging of the head and neck was performed using the standard protocol during bolus administration of intravenous contrast. Multiplanar CT image reconstructions and MIPs were obtained to evaluate the vascular anatomy. Carotid stenosis measurements (when applicable) are obtained utilizing NASCET criteria, using the distal internal carotid diameter as the denominator. CONTRAST:  169mL ISOVUE-370 IOPAMIDOL (ISOVUE-370) INJECTION 76% COMPARISON:  None. FINDINGS: CTA NECK FINDINGS AORTIC ARCH: Normal appearance of the thoracic arch. The origins of the innominate, left Common carotid artery and subclavian artery are widely patent. RIGHT CAROTID SYSTEM: Common carotid artery is widely patent, coursing in a straight line fashion. Normal appearance of the carotid bifurcation without hemodynamically significant stenosis by NASCET criteria. Tortuous RIGHT ICA with multiple folds, no dissection. LEFT CAROTID SYSTEM: Common carotid artery is widely patent, coursing in a straight line fashion. Normal appearance of the carotid bifurcation without hemodynamically significant stenosis by NASCET criteria. Nonocclusive intimal flap mid cervical ICA with 4 mm focally ectatic LEFT ICA, probable pseudoaneurysm. VERTEBRAL ARTERIES:Left vertebral artery  is dominant, rising directly from the aortic arch. Moderate luminal irregularity RIGHT vertebral artery through the distal RIGHT V2 segment. SKELETON: No acute osseous process though bone windows have not been submitted. LEFT mandible molar periapical abscess. OTHER  NECK: Soft tissues of the neck are nonacute though, not tailored for evaluation. Status post LEFT thyroidectomy. UPPER CHEST: Included lung apices are clear. No superior mediastinal lymphadenopathy. DELAYED PHASE: No abnormal intracranial enhancement. CTA HEAD FINDINGS-delayed phase. Due to technical difficulties, CTA HEAD not included on original CTA and subsequent imaging resulted in delayed essentially non diagnostic phase. ANTERIOR CIRCULATION: Patent internal carotid arteries though limited assessment. Patent carotid terminus and proximal M1 segments. Limited assessment of ACA. POSTERIOR CIRCULATION: Limited assessment of vertebral arteries. Basilar artery and proximal PCAs are patent. VENOUS SINUSES: Major dural venous sinuses are patent though not tailored for evaluation on this angiographic examination. ANATOMIC VARIANTS: Limited assessment. DELAYED PHASE: No abnormal intracranial enhancement. MIP images reviewed. IMPRESSION: CTA NECK: 1. 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 2. 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 Patent vertebral arteries. CTA HEAD: 1. Delayed phase, essentially nondiagnostic. No definite emergent large vessel occlusion. Acute findings discussed with and reconfirmed by Dr.Pollina on 08/05/2018 at 12:30 am. Electronically Signed   By: Elon Alas M.D.   On: 08/05/2018 01:06   Mr Jodene Nam Head Wo Contrast  Result Date: 08/05/2018 CLINICAL DATA:  Follow up LEFT carotid artery dissection. RIGHT-sided weakness. EXAM: MRA HEAD WITHOUT CONTRAST TECHNIQUE: Angiographic images of the Circle of Willis were obtained using MRA technique without intravenous contrast. COMPARISON:  CT angiogram head August 05, 2018 FINDINGS: ANTERIOR CIRCULATION: Normal flow related enhancement of the included cervical, petrous, cavernous and supraclinoid internal carotid arteries. Patent anterior communicating artery. Patent  anterior and middle cerebral arteries. Moderate stenosis LEFT M2 origin. No large vessel occlusion, aneurysm. POSTERIOR CIRCULATION: vertebral artery is dominant. Vertebrobasilar arteries are patent, with normal flow related enhancement of the main branch vessels. Patent posterior cerebral arteries. No large vessel occlusion, flow limiting stenosis,  aneurysm. ANATOMIC VARIANTS: None. Source images and MIP images were reviewed. IMPRESSION: 1. No emergent large vessel occlusion. 2. Moderate stenosis LEFT M2 origin. Electronically Signed   By: Elon Alas M.D.   On: 08/05/2018 02:20     ASSESSMENT AND PLAN  52 y.o. female with past medical history of hypertension, diabetes mellitus presents with transient right-sided numbness and weakness. CTA showed Left internal  carotid dissection   Transient ischemic attack Left carotid ICA dissection  Recommend # MRI of the brain without contrast #MRA Head  #Transthoracic Echo  # Start patient on ASA 325mg  daily #Start or continue Atorvastatin 80 mg/other high intensity statin # BP goal:normotension # HBAIC and Lipid profile # Telemetry monitoring # Frequent neuro checks  Please page stroke NP  Or  PA  Or MD from 8am -4 pm  as this patient from this time will be  followed by the stroke.   You can look them up on [email or web address]  Password [facility]    Davinci Glotfelty [facility] Pager Number [phone]

## 2018-08-05 NOTE — H&P (Addendum)
History and Physical    Jessica Lowe NWG:[id number] DOB: 28-Feb-1966 DOA: 08/04/2018  Referring MD/NP/PA: Joseph Berkshire, MD PCP: Sue Lush, PA-C  Patient coming from: Home via EMS.   Chief Complaint: Right-sided weakness  I have personally briefly reviewed patient's old medical records in [place]   HPI: Jessica Lowe is a 52 y.o. female with medical history significant of HTN, DM type II, anemia; who presented with complaints of right-sided weakness.  Symptoms started last night around 7:38 PM while the patient was at work.  She reported having difficulty speaking when trying to get a coworker's attention.  She  unable to get up readily due to her symptoms.  Other associated symptoms included neck pain.  She felt like the symptoms lasted for last time, but her coworker reported symptoms only lasted approximately 5 minutes prior to self resolving.  By the time EMS arrived patient had reported no deficits.  Denies having any changes in vision, fever, chills, nausea, vomiting, or diarrhea.  She has no history of blood clotting disorders or previous stroke.  ED Course: Upon admission to the emergency department patient was noted to be afebrile, respirations 16-23, pulse within normal limits, blood pressure 148/79-179/72, and O2 saturation maintained on room air.  Labs revealed hemoglobin 11.6. UDS negative.  Urinalysis needed for any signs of infection.  Initial CT scan of the brain showed no acute abnormalities.  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  Neurology recommended MRA of the head with and without contrast.  Patient was started on 324 mg of aspirin. [facility] called to admit.  Review of Systems  Constitutional: Negative for chills and fever.  HENT: Negative for nosebleeds.   Eyes: Negative for photophobia and pain.  Respiratory: Negative for  sputum production and shortness of breath.   Cardiovascular: Negative for chest pain and leg swelling.  Gastrointestinal: Negative for abdominal pain, blood in stool, nausea and vomiting.  Genitourinary: Negative for dysuria and hematuria.  Musculoskeletal: Positive for neck pain. Negative for falls.  Skin: Negative for itching.  Neurological: Positive for speech change and focal weakness. Negative for loss of consciousness.  Psychiatric/Behavioral: Negative for suicidal ideas. The patient is not nervous/anxious.   All other systems reviewed and are negative.   Past Medical History:  Diagnosis Date  . Anemia   . Diabetes mellitus without complication ([place])   . HLD (hyperlipidemia)   . Hypertension     Past Surgical History:  Procedure Laterality Date  . KNEE ARTHROSCOPY Right 2008  . OVARIAN CYST REMOVAL Left 2001  . THYROID CYST EXCISION  1999     reports that she has never smoked. She has never used smokeless tobacco. She reports that she does not drink alcohol or use drugs.  No Known Allergies  Family History  Problem Relation Age of Onset  . Diabetes Mother   . Hypertension Mother   . Prostatitis Father   . Hypertension Father     Prior to Admission medications   Medication Sig Start Date End Date Taking? Authorizing Provider  aspirin 81 MG EC tablet Take 81 mg by mouth every evening. Swallow whole.    Yes [provider]  hydrochlorothiazide (HYDRODIURIL) 25 MG tablet Take 25 mg by mouth daily. 11/19/17  Yes [provider]  metFORMIN (GLUCOPHAGE) 500 MG tablet Take 1 tablet (500 mg total) by mouth daily with breakfast.  Patient taking differently: Take 500 mg by mouth 2 (two) times daily with a meal.  11/26/14  Yes Lance Bosch, NP  pravastatin (PRAVACHOL) 20 MG tablet Take 20 mg by mouth every evening.  11/14/17  Yes [provider]  amoxicillin (AMOXIL) 500 MG capsule Take 1 capsule (500 mg total) by mouth 3 (three) times daily. Patient  not taking: Reported on 11/21/2017 02/18/15   Micheline Chapman, NP  telmisartan (MICARDIS) 40 MG tablet Take 40 mg by mouth daily. 11/19/17   [provider]  traMADol (ULTRAM) 50 MG tablet Take 1 tablet (50 mg total) by mouth every 8 (eight) hours as needed. Patient not taking: Reported on 11/21/2017 02/18/15   Micheline Chapman, NP  triamterene-hydrochlorothiazide (DYAZIDE) 37.5-25 MG per capsule Take 1 each (1 capsule total) by mouth daily. Patient not taking: Reported on 11/21/2017 11/26/14   Lance Bosch, NP    Physical Exam:  Constitutional: obese female in NAD, calm, comfortable Vitals:   08/04/18 2117 08/04/18 2130 08/04/18 2344 08/05/18 0104  BP: (!) 179/72 (!) 161/90 (!) 148/79 (!) 162/93  Pulse: (!) 7 69 71 73  Resp: 16 20 (!) 23 20  Temp: 97.8 F (36.6 C)     TempSrc: Oral     SpO2: 98% 100% 100%    Eyes: PERRL, lids and conjunctivae normal ENMT: Mucous membranes are moist. Posterior pharynx clear of any exudate or lesions.  Neck: normal, supple, no masses, no thyromegaly Respiratory: clear to auscultation bilaterally, no wheezing, no crackles. Normal respiratory effort. No accessory muscle use.  Cardiovascular: Regular rate and rhythm, no murmurs / rubs / gallops. No extremity edema. 2+ pedal pulses. No carotid bruits.  Abdomen: no tenderness, no masses palpated. No hepatosplenomegaly. Bowel sounds positive.  Musculoskeletal: no clubbing / cyanosis. No joint deformity upper and lower extremities. Good ROM, no contractures. Normal muscle tone.  Skin: no rashes, lesions, ulcers. No induration Neurologic: CN 2-12 grossly intact. Sensation intact, DTR normal. Strength 5/5 in all 4.  Psychiatric: Normal judgment and insight. Alert and oriented x 3. Normal mood.     Labs on Admission: I have personally reviewed following labs and imaging studies  CBC: Recent Labs  Lab 08/04/18 2138 08/04/18 2148  WBC 7.1  --   NEUTROABS 3.5  --   HGB 11.6* 13.3  HCT 38.8  39.0  MCV 75.0*  --   PLT 368  --    Basic Metabolic Panel: Recent Labs  Lab 08/04/18 2138 08/04/18 2148  NA 139 140  K 3.9 4.0  CL 98 100  CO2 31  --   GLUCOSE 110* 110*  BUN 11 12  CREATININE 0.68 0.70  CALCIUM 9.3  --    GFR: CrCl cannot be calculated (Unknown ideal weight.). Liver Function Tests: Recent Labs  Lab 08/04/18 2138  AST 16  ALT 14  ALKPHOS 56  BILITOT 0.6  PROT 7.6  ALBUMIN 3.4*   No results for input(s): LIPASE, AMYLASE in the last 168 hours. No results for input(s): AMMONIA in the last 168 hours. Coagulation Profile: Recent Labs  Lab 08/04/18 2138  INR 0.95   Cardiac Enzymes: No results for input(s): CKTOTAL, CKMB, CKMBINDEX, TROPONINI in the last 168 hours. BNP (last 3 results) No results for input(s): PROBNP in the last 8760 hours. HbA1C: No results for input(s): HGBA1C in the last 72 hours. CBG: No results for input(s): GLUCAP in the last 168 hours. Lipid Profile: No results for input(s): CHOL, HDL, LDLCALC, TRIG, CHOLHDL,  LDLDIRECT in the last 72 hours. Thyroid Function Tests: No results for input(s): TSH, T4TOTAL, FREET4, T3FREE, THYROIDAB in the last 72 hours. Anemia Panel: No results for input(s): VITAMINB12, FOLATE, FERRITIN, TIBC, IRON, RETICCTPCT in the last 72 hours. Urine analysis:    Component Value Date/Time   COLORURINE YELLOW 08/05/2018 0056   APPEARANCEUR CLEAR 08/05/2018 0056   LABSPEC 1.035 (H) 08/05/2018 0056   PHURINE 5.0 08/05/2018 0056   GLUCOSEU NEGATIVE 08/05/2018 0056   HGBUR NEGATIVE 08/05/2018 0056   BILIRUBINUR NEGATIVE 08/05/2018 0056   KETONESUR NEGATIVE 08/05/2018 0056   PROTEINUR NEGATIVE 08/05/2018 0056   NITRITE NEGATIVE 08/05/2018 0056   LEUKOCYTESUR NEGATIVE 08/05/2018 0056   Sepsis Labs: No results found for this or any previous visit (from the past 240 hour(s)).   Radiological Exams on Admission: Ct Angio Head W Or Wo Contrast  Result Date: 08/05/2018 CLINICAL DATA:  Acute onset  RIGHT-sided weakness and garbled speech. Persistent dizziness. History of hypertension and diabetes. EXAM: CT ANGIOGRAPHY HEAD AND NECK TECHNIQUE: Multidetector CT imaging of the head and neck was performed using the standard protocol during bolus administration of intravenous contrast. Multiplanar CT image reconstructions and MIPs were obtained to evaluate the vascular anatomy. Carotid stenosis measurements (when applicable) are obtained utilizing NASCET criteria, using the distal internal carotid diameter as the denominator. CONTRAST:  15mL ISOVUE-370 IOPAMIDOL (ISOVUE-370) INJECTION 76% COMPARISON:  None. FINDINGS: CTA NECK FINDINGS AORTIC ARCH: Normal appearance of the thoracic arch. The origins of the innominate, left Common carotid artery and subclavian artery are widely patent. RIGHT CAROTID SYSTEM: Common carotid artery is widely patent, coursing in a straight line fashion. Normal appearance of the carotid bifurcation without hemodynamically significant stenosis by NASCET criteria. Tortuous RIGHT ICA with multiple folds, no dissection. LEFT CAROTID SYSTEM: Common carotid artery is widely patent, coursing in a straight line fashion. Normal appearance of the carotid bifurcation without hemodynamically significant stenosis by NASCET criteria. Nonocclusive intimal flap mid cervical ICA with 4 mm focally ectatic LEFT ICA, probable pseudoaneurysm. VERTEBRAL ARTERIES:Left vertebral artery is dominant, rising directly from the aortic arch. Moderate luminal irregularity RIGHT vertebral artery through the distal RIGHT V2 segment. SKELETON: No acute osseous process though bone windows have not been submitted. LEFT mandible molar periapical abscess. OTHER NECK: Soft tissues of the neck are nonacute though, not tailored for evaluation. Status post LEFT thyroidectomy. UPPER CHEST: Included lung apices are clear. No superior mediastinal lymphadenopathy. DELAYED PHASE: No abnormal intracranial enhancement. CTA HEAD  FINDINGS-delayed phase. Due to technical difficulties, CTA HEAD not included on original CTA and subsequent imaging resulted in delayed essentially non diagnostic phase. ANTERIOR CIRCULATION: Patent internal carotid arteries though limited assessment. Patent carotid terminus and proximal M1 segments. Limited assessment of ACA. POSTERIOR CIRCULATION: Limited assessment of vertebral arteries. Basilar artery and proximal PCAs are patent. VENOUS SINUSES: Major dural venous sinuses are patent though not tailored for evaluation on this angiographic examination. ANATOMIC VARIANTS: Limited assessment. DELAYED PHASE: No abnormal intracranial enhancement. MIP images reviewed. IMPRESSION: CTA NECK: 1. 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 2. 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 Patent vertebral arteries. CTA HEAD: 1. Delayed phase, essentially nondiagnostic. No definite emergent large vessel occlusion. Acute findings discussed with and reconfirmed by Dr.Pollina on 08/05/2018 at 12:30 am. Electronically Signed   By: Elon Alas M.D.   On: 08/05/2018 01:06   Ct Head Wo Contrast  Result Date: 08/04/2018 CLINICAL DATA:  Right-sided weakness. EXAM: CT HEAD WITHOUT CONTRAST TECHNIQUE: Contiguous  axial images were obtained from the base of the skull through the vertex without intravenous contrast. COMPARISON:  CT scan of November 20, 2017. FINDINGS: Brain: No evidence of acute infarction, hemorrhage, hydrocephalus, extra-axial collection or mass lesion/mass effect. Vascular: No hyperdense vessel or unexpected calcification. Skull: Normal. Negative for fracture or focal lesion. Sinuses/Orbits: No acute finding. Other: None. IMPRESSION: Normal head CT. Electronically Signed   By: Marijo Conception, M.D.   On: 08/04/2018 21:24   Ct Angio Neck W And/or Wo Contrast  Result Date: 08/05/2018 CLINICAL DATA:  Acute onset RIGHT-sided weakness and  garbled speech. Persistent dizziness. History of hypertension and diabetes. EXAM: CT ANGIOGRAPHY HEAD AND NECK TECHNIQUE: Multidetector CT imaging of the head and neck was performed using the standard protocol during bolus administration of intravenous contrast. Multiplanar CT image reconstructions and MIPs were obtained to evaluate the vascular anatomy. Carotid stenosis measurements (when applicable) are obtained utilizing NASCET criteria, using the distal internal carotid diameter as the denominator. CONTRAST:  177mL ISOVUE-370 IOPAMIDOL (ISOVUE-370) INJECTION 76% COMPARISON:  None. FINDINGS: CTA NECK FINDINGS AORTIC ARCH: Normal appearance of the thoracic arch. The origins of the innominate, left Common carotid artery and subclavian artery are widely patent. RIGHT CAROTID SYSTEM: Common carotid artery is widely patent, coursing in a straight line fashion. Normal appearance of the carotid bifurcation without hemodynamically significant stenosis by NASCET criteria. Tortuous RIGHT ICA with multiple folds, no dissection. LEFT CAROTID SYSTEM: Common carotid artery is widely patent, coursing in a straight line fashion. Normal appearance of the carotid bifurcation without hemodynamically significant stenosis by NASCET criteria. Nonocclusive intimal flap mid cervical ICA with 4 mm focally ectatic LEFT ICA, probable pseudoaneurysm. VERTEBRAL ARTERIES:Left vertebral artery is dominant, rising directly from the aortic arch. Moderate luminal irregularity RIGHT vertebral artery through the distal RIGHT V2 segment. SKELETON: No acute osseous process though bone windows have not been submitted. LEFT mandible molar periapical abscess. OTHER NECK: Soft tissues of the neck are nonacute though, not tailored for evaluation. Status post LEFT thyroidectomy. UPPER CHEST: Included lung apices are clear. No superior mediastinal lymphadenopathy. DELAYED PHASE: No abnormal intracranial enhancement. CTA HEAD FINDINGS-delayed phase. Due to  technical difficulties, CTA HEAD not included on original CTA and subsequent imaging resulted in delayed essentially non diagnostic phase. ANTERIOR CIRCULATION: Patent internal carotid arteries though limited assessment. Patent carotid terminus and proximal M1 segments. Limited assessment of ACA. POSTERIOR CIRCULATION: Limited assessment of vertebral arteries. Basilar artery and proximal PCAs are patent. VENOUS SINUSES: Major dural venous sinuses are patent though not tailored for evaluation on this angiographic examination. ANATOMIC VARIANTS: Limited assessment. DELAYED PHASE: No abnormal intracranial enhancement. MIP images reviewed. IMPRESSION: CTA NECK: 1. 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 2. 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 Patent vertebral arteries. CTA HEAD: 1. Delayed phase, essentially nondiagnostic. No definite emergent large vessel occlusion. Acute findings discussed with and reconfirmed by Dr.Pollina on 08/05/2018 at 12:30 am. Electronically Signed   By: Elon Alas M.D.   On: 08/05/2018 01:06    EKG: Independently reviewed. 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  Presented with complaints of right-sided weakness and slurred speech now resolved.  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  Neurology was consulted and recommended checking a MRA of the brain and start patient on aspirin -  Admit to telemetry - Continue aspirin - Follow-up MRA  - Appreciate neurology consultative services, will follow-up for further recommendations   Essential hypertension: Blood pressures noted to be elevated up to 179/73. - Continue hydrochlorothiazide and losartan  Hyperlipidemia  - Follow-up lipid  panel - Continue pravastatin - Consider need changing or increasing dosage  Diabetes mellitus type 2: Last hemoglobin A1c noted to be 7 on 03/04/2018. - Follow-up hemoglobin A1c - Hypoglycemic protocol - Held metformin - CBG's q. before meals and at bedtime   Microcytic microchromic anemia: Hemoglobin 11.6 on admission with low MCV and MCH and elevated RDW.  Symptoms suggestive of iron deficiency.  Patient denied any reports of bleeding - May warrant checking iron studies in the outpatient setting  Morbid obesity: Patient is approximately 5 foot 10 and weight reported greater than 369 lb.  Estimated BMI greater than 50. - Check height and weight  DVT prophylaxis: lovenox Code Status: Full  Family Communication: Discussed plan of care with the patient family present bedside Disposition Plan: Likely discharge home in 1 to 2 days Consults called: neurology Admission status: observation  Norval Morton MD [facility] Pager [phone]   If 7PM-7AM, please contact night-coverage [email or web address] Password [email or web address]  08/05/2018, 1:32 AM

## 2018-08-05 NOTE — ED Notes (Signed)
Pt off unit; in MRI.

## 2018-08-05 NOTE — ED Provider Notes (Signed)
Patient signed out to me to follow-up on CTA neck and head.  CTA of neck shows evidence of left carotid dissection.  There were technical limitations for the CTA head, cerebral arteries were not well visualized.  Discussed with Dr. Lorraine Lax.  Recommends aspirin, does not require heparinization at this time.  Would proceed with MRA head to complete study.  Admit to hospitalist service for monitoring.   Orpah Greek, MD 08/05/18 [phone]

## 2018-08-05 NOTE — ED Notes (Signed)
Pt stating she does not want to wait in hallway anymore for bed upstairs. Asked this RN if she would notify admitting provider that she would like to be discharged. Amion text page sent to admitting MD.

## 2018-08-05 NOTE — ED Notes (Signed)
Admitting provider with patient at this time

## 2018-08-05 NOTE — Progress Notes (Addendum)
[facility] Progress Note  Subjective: (No charge, pt admitted after MN).   Vitals:   08/05/18 0104 08/05/18 0252 08/05/18 0315 08/05/18 0733  BP: (!) 162/93 128/72  (!) 146/88  Pulse: 73 62    Resp: 20 19 17    Temp:      TempSrc:      SpO2:   100%     Inpatient medications: .  stroke: mapping our early stages of recovery book   Does not apply Once  . [START ON 08/06/2018] aspirin  324 mg Oral Daily  . enoxaparin (LOVENOX) injection  40 mg Subcutaneous Q24H  . hydrochlorothiazide  25 mg Oral Daily  . insulin aspart  0-5 Units Subcutaneous QHS  . insulin aspart  0-9 Units Subcutaneous TID WC  . iopamidol      . losartan  100 mg Oral Daily  . pravastatin  20 mg Oral QPM    acetaminophen **OR** acetaminophen (TYLENOL) oral liquid 160 mg/5 mL **OR** acetaminophen, senna-docusate  Exam: Eyes: PERRL, lids and conjunctivae normal ENMT: Mucous membranes are moist. Posterior pharynx clear of any exudate or lesions.  Neck: normal, supple, no masses, no thyromegaly Respiratory: clear to auscultation bilaterally, no wheezing, no crackles. Normal respiratory effort. No accessory muscle use.  Cardiovascular: Regular rate and rhythm, no murmurs / rubs / gallops. No extremity edema. 2+ pedal pulses. 2+ radial pulses and carotid pulses. No carotid bruits.  Abdomen: no tenderness, no masses palpated. No hepatosplenomegaly. Bowel sounds positive.  Musculoskeletal: no clubbing / cyanosis. No joint deformity upper and lower extremities. Good ROM, no contractures. Normal muscle tone.  Skin: no rashes, lesions, ulcers. No induration Neurologic: CN 2-12 grossly intact. Sensation intact, DTR normal. Strength 5/5 in all 4.  Psychiatric: Normal judgment and insight. Alert and oriented x 3. Normal mood.    Presentation Summary: Jessica Lowe is a 52 y.o. female with medical history significant of HTN, DM type II, anemia; who presented with complaints of right-sided weakness.  Symptoms started  last night around 7:38 PM while the patient was at work.  She reported having difficulty speaking when trying to get a coworker's attention.  She  unable to get up readily due to her symptoms.  Other associated symptoms included neck pain.  She felt like the symptoms lasted for last time, but her coworker reported symptoms only lasted approximately 5 minutes prior to self resolving.  By the time EMS arrived patient had reported no deficits.  Denies having any changes in vision, fever, chills, nausea, vomiting, or diarrhea.  She has no history of blood clotting disorders or previous stroke. ED Course: Upon admission to the emergency department patient was noted to be afebrile, respirations 16-23, pulse within normal limits, blood pressure 148/79-179/72, and O2 saturation maintained on room air.  Labs revealed hemoglobin 11.6. UDS negative.  Urinalysis needed for any signs of infection.  Initial CT scan of the brain showed no acute abnormalities.  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  Neurology recommended MRA of the head with and without contrast.  Patient was started on 324 mg of aspirin. [facility] called to admit.     Home meds:   - ecasa 81/ pravastatin 20    - triamterene- hydrochlorothiazide 37.5-25 daily/ hydrochlorothiazide 25 mg qd/ losartan 100 qd  - metformin 500 bid ac  - tramadol 50 tid prn     Hospital Problems/  Course:  Right-sided weakness / L internal carotid artery dissection: Acute, transient speech and R sided symptoms.  Presented with complaints of right-sided weakness and slurred speech now resolved.  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  Neurology was consulted and recommended  MRA of the brain and start patient on aspirin - appreciate neurology rec's :    MRI of the  brain without contrast    MRA Head    Transthoracic Echo    Start patient on ASA 325mg  daily   Started Atorvastatin 80 mg and will dc pravastatin   BP goal:normotension   HBAIC and Lipid profile   Telemetry monitoring   Frequent neuro checks   Essential hypertension: Blood pressures noted to be elevated up to 179/73. - Continue hydrochlorothiazide and losartan - is not taking Dyazide at home, have dc'd from PTA list - get BP down some more, get SBP < 140 for now - adding prn IV hydralazine, added norvasc 5 bid  Hyperlipidemia  - Follow-up lipid panel - Continue pravastatin - Consider need changing or increasing dosage  Diabetes mellitus type 2: Last hemoglobin A1c noted to be 7 on 03/04/2018. - Follow-up hemoglobin A1c - Hypoglycemic protocol - Hold metformin given need for NPO, testing - CBG's q. before meals and at bedtime   Microcytic microchromic anemia: Hemoglobin 11.6 on admission with low MCV and MCH and elevated RDW.  Symptoms suggestive of iron deficiency.  Patient denied any reports of bleeding - May warrant checking iron studies in the outpatient setting  Morbid obesity: Patient is approximately 5 foot 10 and weight reported greater than 369 lb.  Estimated BMI greater than 50. - Check height and weight  Hypothyroidism - says she takes levothyroxine, will find out dose  DVT prophylaxis: lovenox Code Status: Full  Family Communication: Discussed plan of care with the patient family present bedside Disposition Plan: Likely discharge home in 1 to 2 days Consults called: neurology Admission status: observation   Kelly Splinter MD [facility] pgr [phone] 08/05/2018, 8:35 AM   Recent Labs  Lab 08/04/18 2138 08/04/18 2148  NA 139 140  K 3.9 4.0  CL 98 100  CO2 31  --   GLUCOSE 110* 110*  BUN 11 12  CREATININE 0.68 0.70  CALCIUM 9.3  --    Recent Labs  Lab 08/04/18 2138  AST 16  ALT 14  ALKPHOS 56  BILITOT 0.6  PROT 7.6  ALBUMIN  3.4*   Recent Labs  Lab 08/04/18 2138 08/04/18 2148 08/05/18 0308  WBC 7.1  --  6.9  NEUTROABS 3.5  --   --   HGB 11.6* 13.3 11.9*  HCT 38.8 39.0 38.7  MCV 75.0*  --  74.0*  PLT 368  --  335   Iron/TIBC/Ferritin/ %Sat    Component Value Date/Time   IRON 23 (L) 04/18/2007 1041   TIBC 270 04/18/2007 1041   FERRITIN 31 04/18/2007 1041   IRONPCTSAT 9 (L) 04/18/2007 1041

## 2018-08-05 NOTE — Progress Notes (Signed)
  Echocardiogram 2D Echocardiogram has been performed.  Jessica Lowe G [name] 08/05/2018, 4:11 PM

## 2018-08-06 ENCOUNTER — Encounter: Payer: Self-pay | Admitting: Neurology

## 2018-08-06 DIAGNOSIS — E785 Hyperlipidemia, unspecified: Secondary | ICD-10-CM | POA: Diagnosis not present

## 2018-08-06 DIAGNOSIS — I10 Essential (primary) hypertension: Secondary | ICD-10-CM | POA: Diagnosis not present

## 2018-08-06 DIAGNOSIS — I771 Stricture of artery: Secondary | ICD-10-CM

## 2018-08-06 DIAGNOSIS — E119 Type 2 diabetes mellitus without complications: Secondary | ICD-10-CM | POA: Diagnosis not present

## 2018-08-06 LAB — GLUCOSE, CAPILLARY: Glucose-Capillary: 114 mg/dL — ABNORMAL HIGH (ref 70–99)

## 2018-08-06 MED ORDER — LOSARTAN POTASSIUM 100 MG PO TABS: 100.0000 mg | ORAL_TABLET | Freq: Every day | ORAL | 5 refills | Status: AC

## 2018-08-06 MED ORDER — ASPIRIN 81 MG PO TBEC: 81.0000 mg | DELAYED_RELEASE_TABLET | Freq: Every day | ORAL | 0 refills | Status: AC

## 2018-08-06 MED ORDER — AMLODIPINE BESYLATE 10 MG PO TABS: 10.0000 mg | ORAL_TABLET | Freq: Every day | ORAL | 11 refills | Status: AC

## 2018-08-06 MED ORDER — ATORVASTATIN CALCIUM 20 MG PO TABS: 20.0000 mg | ORAL_TABLET | Freq: Every evening | ORAL | 5 refills | Status: AC

## 2018-08-06 MED ORDER — CLOPIDOGREL BISULFATE 75 MG PO TABS: 75.0000 mg | ORAL_TABLET | Freq: Every day | ORAL | 5 refills | Status: AC

## 2018-08-06 MED ORDER — TRIAMTERENE-HCTZ 37.5-25 MG PO TABS: 0.5000 | ORAL_TABLET | Freq: Every day | ORAL | 5 refills | Status: AC

## 2018-08-06 NOTE — Discharge Instructions (Signed)
Follow up with a neurologist.  They should call you on a phone for an appointment. Return for worsening pain.   1)Take Aspirin 81 mg and  Plavix together for 3 weeks and then after that take Plavix 75 mg  daily indefinitely 2)Right sided weakness and speech disturbance now spontaneously resolved <60min----no evidence of acute stroke, take medications as prescribed, follow-up with neurologist as outpatient as advised 3) decrease Maxzide [triamterene/HCTZ] to half (0.5)  tablet daily, take amlodipine 10 mg daily and losartan 100 mg daily for blood pressure control 4) stop pravastatin, start Lipitor/atorvastatin 20 mg every evening for stroke prevention and cholesterol control 5) you are already taking aspirin and Plavix which are blood thinners so Avoid ibuprofen/Advil/Aleve/Motrin/Goody Powders/Naproxen/BC powders/Etodolac/Meloxicam/Diclofenac/Indomethacin and other Nonsteroidal anti-inflammatory medications as these will make you more likely to bleed and can cause stomach ulcers, can also cause Kidney problems.  6) follow-up with your primary care physician for ongoing management of your diabetes and obesity with a plan for weight loss 7) resume pre-admission dose of levothyroxine, recheck TSH with primary care provider in a couple months

## 2018-08-06 NOTE — Discharge Summary (Signed)
Jessica Lowe, is a 51 y.o. female  DOB [birth year]  MRN [id number].  Admission date:  08/04/2018  Admitting Physician  Norval Morton, MD  Discharge Date:  08/06/2018   Primary MD  Sue Lush, PA-C  Recommendations for primary care physician for things to follow:   Follow up with a neurologist.  They should call you on a phone for an appointment. Return for worsening pain.   1)Take Aspirin 81 mg and  Plavix together for 3 weeks and then after that take Plavix 75 mg  daily indefinitely 2)Right sided weakness and speech disturbance now spontaneously resolved <60min----no evidence of acute stroke, take medications as prescribed, follow-up with neurologist as outpatient as advised 3) decrease Maxzide [triamterene/HCTZ] to half (0.5)  tablet daily, take amlodipine 10 mg daily and losartan 100 mg daily for blood pressure control 4) stop pravastatin, start Lipitor/atorvastatin 20 mg every evening for stroke prevention and cholesterol control 5) you are already taking aspirin and Plavix which are blood thinners so Avoid ibuprofen/Advil/Aleve/Motrin/Goody Powders/Naproxen/BC powders/Etodolac/Meloxicam/Diclofenac/Indomethacin and other Nonsteroidal anti-inflammatory medications as these will make you more likely to bleed and can cause stomach ulcers, can also cause Kidney problems.  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 Anemia   . Diabetes mellitus without complication ([place])   . HLD (hyperlipidemia)   . Hypertension     Past Surgical History:  Procedure Laterality Date  . KNEE ARTHROSCOPY Right 2008  . OVARIAN CYST REMOVAL Left 2001  . THYROID CYST EXCISION  1999       HPI  from the history and physical done on the day of admission:    HPI: Jessica Lowe is a 52 y.o. female with medical history significant of HTN, DM type II, anemia; who presented with complaints of right-sided weakness.  Symptoms started last night around 7:38 PM while the patient was at work.  She reported having difficulty speaking when trying to get a coworker's attention.  She  unable to get up readily due to her symptoms.  Other associated symptoms included neck pain.  She felt like the symptoms lasted for last time, but her coworker reported symptoms only lasted approximately 5 minutes prior to self resolving.  By the time EMS arrived patient had reported no deficits.  Denies having any changes in vision, fever, chills, nausea, vomiting, or diarrhea.  She has no history of blood clotting disorders or previous stroke.  ED Course:  Upon admission to the emergency department patient was noted to be afebrile, respirations 16-23, pulse within normal limits, blood pressure 148/79-179/72, and O2 saturation maintained on room air.  Labs revealed hemoglobin 11.6. UDS negative.  Urinalysis needed for any signs of infection.  Initial CT scan of the brain showed no acute abnormalities.  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  Neurology recommended MRA of the head with and without contrast.  Patient was started on 324 mg of aspirin. [facility] called to admit.    Hospital Course:     1)Transient Right-sided weakness/speech  disturbanceAcute-  Transient speech and R sided symptoms. Presented with complaints of right-sided weakness and slurred speech now resolved. CT head neg ,    --initially there was suspicion of possible eft internal carotid artery dissection with 4 mm pseudoaneurysm on  CT angiogram of the head, MRI brain and MRA head without acute abnormalities after further evaluation and reread and  Neurology was consulted and recommended   discharge home on aspirin and Plavix combo for 3 weeks and then Plavix alone after that, discharged on Lipitor, LDL is 87 with an HDL of 61, A1c 6.6, on telemetry no significant arrhythmia... Work-up negative for acute strokes, echo with preserved EF over 65%  2)Essential hypertension: Blood pressures noted to be elevated up to 179/73--- okay to discharge on losartan in half dose Mazxide, amlodipine added for better BP control    3)DM2-A1c is 6.6, excellent control overall, resume home regimen  4)Microcytic microchromic anemia: Hemoglobin 11.6 on admission with low MCV and MCH and elevated RDW.  Follow-up with outpatient PCP for follow-up work-up and management of possible iron deficiency anemia  5)Morbid obesity:Patient is approximately 5 foot 10 and weight reported greater than 369 lb.Estimated BMI greater than 50, this complicates overall care  6)Hypothyroidism - says she takes levothyroxine, patient may resume home dose of levothyroxine  Discharge Condition: stable  Follow UP  Follow-up Information    [facility]. Schedule an appointment as soon as possible for a visit in 4 week(s).   Specialty:  Neurology Contact information: [address] [place] [phone]          Consults obtained - Neurology  Diet and Activity recommendation:  As advised  Discharge Instructions    Discharge Instructions    Ambulatory referral to Neurology   Complete by:  As directed    Right sided weakness  spontaneously resolved <36min   Call MD for:  difficulty breathing, headache or visual disturbances   Complete by:  As directed    Call MD for:  persistant dizziness or light-headedness   Complete by:  As directed    Call MD for:  persistant nausea and vomiting   Complete by:  As directed    Call MD for:  severe uncontrolled pain   Complete by:  As directed    Call MD for:  temperature >100.4   Complete by:  As directed    Diet - low sodium heart healthy   Complete by:  As directed    Discharge instructions   Complete by:  As directed    Follow up with a neurologist.  They should call you on a phone for an appointment. Return for worsening pain.   1)Take Aspirin 81 mg and  Plavix together for 3 weeks and then after that take Plavix 75  mg  daily indefinitely 2)Right sided weakness and speech disturbance now spontaneously resolved <97min----no evidence of acute stroke, take medications as prescribed, follow-up with neurologist as outpatient as advised 3) decrease Maxzide [triamterene/HCTZ] to half (0.5)  tablet daily, take amlodipine 10 mg daily and losartan 100 mg daily for blood pressure control 4) stop pravastatin, start Lipitor/atorvastatin 20 mg every evening for stroke prevention and cholesterol control 5) you are already taking aspirin and Plavix which are blood thinners so Avoid ibuprofen/Advil/Aleve/Motrin/Goody Powders/Naproxen/BC powders/Etodolac/Meloxicam/Diclofenac/Indomethacin and other Nonsteroidal anti-inflammatory medications as these will make you more likely to bleed and can cause stomach ulcers, can also cause Kidney problems.  6) follow-up with your primary care physician for ongoing management of your diabetes and obesity with a plan for weight loss 7) resume pre-admission dose of levothyroxine, recheck TSH with primary care provider in a couple months   Increase activity slowly   Complete by:  As directed         Discharge Medications     Allergies as of 08/06/2018    No Known Allergies     Medication List    STOP taking these medications   amoxicillin 500 MG capsule Commonly known as:  AMOXIL   pravastatin 20 MG tablet Commonly known as:  PRAVACHOL   triamterene-hydrochlorothiazide 37.5-25 MG capsule Commonly known as:  DYAZIDE Replaced by:  triamterene-hydrochlorothiazide 37.5-25 MG tablet     TAKE these medications   amLODipine 10 MG tablet Commonly known as:  NORVASC Take 1 tablet (10 mg total) by mouth daily.   aspirin 81 MG EC tablet Take 1 tablet (81 mg total) by mouth daily with breakfast. Swallow whole. What changed:  when to take this   atorvastatin 20 MG tablet Commonly known as:  LIPITOR Take 1 tablet (20 mg total) by mouth every evening.   clopidogrel 75 MG tablet Commonly known as:  PLAVIX Take 1 tablet (75 mg total) by mouth daily. For Blood Thinner   losartan 100 MG tablet Commonly known as:  COZAAR Take 1 tablet (100 mg total) by mouth daily.   metFORMIN 500 MG tablet Commonly known as:  GLUCOPHAGE Take 1 tablet (500 mg total) by mouth daily with breakfast. What changed:  when to take this   traMADol 50 MG tablet Commonly known as:  ULTRAM Take 1 tablet (50 mg total) by mouth every 8 (eight) hours as needed.   triamterene-hydrochlorothiazide 37.5-25 MG tablet Commonly known as:  MAXZIDE-25 Take 0.5 tablets by mouth daily. Take half Tablet daily Replaces:  triamterene-hydrochlorothiazide 37.5-25 MG capsule       Major procedures and Radiology Reports - PLEASE review detailed and final reports for all details, in brief -     Ct Angio Head W Or Wo Contrast  Result Date: 08/05/2018 CLINICAL DATA:  Acute onset RIGHT-sided weakness and garbled speech. Persistent dizziness. History of hypertension and diabetes. EXAM: CT ANGIOGRAPHY HEAD AND NECK TECHNIQUE: Multidetector CT imaging of the head and neck was performed using the standard protocol during bolus administration of intravenous contrast. Multiplanar  CT image reconstructions and MIPs were obtained to evaluate the vascular anatomy. Carotid stenosis measurements (when applicable) are obtained utilizing NASCET criteria, using the distal internal carotid diameter as the denominator. CONTRAST:  150mL ISOVUE-370 IOPAMIDOL (ISOVUE-370) INJECTION 76% COMPARISON:  None. FINDINGS: CTA NECK FINDINGS AORTIC ARCH: Normal appearance of the thoracic arch. The origins of the innominate, left Common carotid artery and subclavian artery are widely patent. RIGHT CAROTID SYSTEM: Common carotid artery is widely patent, coursing in  a straight line fashion. Normal appearance of the carotid bifurcation without hemodynamically significant stenosis by NASCET criteria. Tortuous RIGHT ICA with multiple folds, no dissection. LEFT CAROTID SYSTEM: Common carotid artery is widely patent, coursing in a straight line fashion. Normal appearance of the carotid bifurcation without hemodynamically significant stenosis by NASCET criteria. Nonocclusive intimal flap mid cervical ICA with 4 mm focally ectatic LEFT ICA, probable pseudoaneurysm. VERTEBRAL ARTERIES:Left vertebral artery is dominant, rising directly from the aortic arch. Moderate luminal irregularity RIGHT vertebral artery through the distal RIGHT V2 segment. SKELETON: No acute osseous process though bone windows have not been submitted. LEFT mandible molar periapical abscess. OTHER NECK: Soft tissues of the neck are nonacute though, not tailored for evaluation. Status post LEFT thyroidectomy. UPPER CHEST: Included lung apices are clear. No superior mediastinal lymphadenopathy. DELAYED PHASE: No abnormal intracranial enhancement. CTA HEAD FINDINGS-delayed phase. Due to technical difficulties, CTA HEAD not included on original CTA and subsequent imaging resulted in delayed essentially non diagnostic phase. ANTERIOR CIRCULATION: Patent internal carotid arteries though limited assessment. Patent carotid terminus and proximal M1 segments.  Limited assessment of ACA. POSTERIOR CIRCULATION: Limited assessment of vertebral arteries. Basilar artery and proximal PCAs are patent. VENOUS SINUSES: Major dural venous sinuses are patent though not tailored for evaluation on this angiographic examination. ANATOMIC VARIANTS: Limited assessment. DELAYED PHASE: No abnormal intracranial enhancement. MIP images reviewed. IMPRESSION: CTA NECK: 1. 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 2. 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 Patent vertebral arteries. CTA HEAD: 1. Delayed phase, essentially nondiagnostic. No definite emergent large vessel occlusion. Acute findings discussed with and reconfirmed by Dr.Pollina on 08/05/2018 at 12:30 am. Electronically Signed   By: Elon Alas M.D.   On: 08/05/2018 01:06   Ct Head Wo Contrast  Result Date: 08/04/2018 CLINICAL DATA:  Right-sided weakness. EXAM: CT HEAD WITHOUT CONTRAST TECHNIQUE: Contiguous axial images were obtained from the base of the skull through the vertex without intravenous contrast. COMPARISON:  CT scan of November 20, 2017. FINDINGS: Brain: No evidence of acute infarction, hemorrhage, hydrocephalus, extra-axial collection or mass lesion/mass effect. Vascular: No hyperdense vessel or unexpected calcification. Skull: Normal. Negative for fracture or focal lesion. Sinuses/Orbits: No acute finding. Other: None. IMPRESSION: Normal head CT. Electronically Signed   By: Marijo Conception, M.D.   On: 08/04/2018 21:24   Ct Angio Neck W And/or Wo Contrast  Result Date: 08/05/2018 CLINICAL DATA:  Acute onset RIGHT-sided weakness and garbled speech. Persistent dizziness. History of hypertension and diabetes. EXAM: CT ANGIOGRAPHY HEAD AND NECK TECHNIQUE: Multidetector CT imaging of the head and neck was performed using the standard protocol during bolus administration of intravenous contrast. Multiplanar CT image reconstructions and  MIPs were obtained to evaluate the vascular anatomy. Carotid stenosis measurements (when applicable) are obtained utilizing NASCET criteria, using the distal internal carotid diameter as the denominator. CONTRAST:  126mL ISOVUE-370 IOPAMIDOL (ISOVUE-370) INJECTION 76% COMPARISON:  None. FINDINGS: CTA NECK FINDINGS AORTIC ARCH: Normal appearance of the thoracic arch. The origins of the innominate, left Common carotid artery and subclavian artery are widely patent. RIGHT CAROTID SYSTEM: Common carotid artery is widely patent, coursing in a straight line fashion. Normal appearance of the carotid bifurcation without hemodynamically significant stenosis by NASCET criteria. Tortuous RIGHT ICA with multiple folds, no dissection. LEFT CAROTID SYSTEM: Common carotid artery is widely patent, coursing in a straight line fashion. Normal appearance of the carotid bifurcation without hemodynamically significant stenosis by NASCET criteria. Nonocclusive intimal flap mid cervical  ICA with 4 mm focally ectatic LEFT ICA, probable pseudoaneurysm. VERTEBRAL ARTERIES:Left vertebral artery is dominant, rising directly from the aortic arch. Moderate luminal irregularity RIGHT vertebral artery through the distal RIGHT V2 segment. SKELETON: No acute osseous process though bone windows have not been submitted. LEFT mandible molar periapical abscess. OTHER NECK: Soft tissues of the neck are nonacute though, not tailored for evaluation. Status post LEFT thyroidectomy. UPPER CHEST: Included lung apices are clear. No superior mediastinal lymphadenopathy. DELAYED PHASE: No abnormal intracranial enhancement. CTA HEAD FINDINGS-delayed phase. Due to technical difficulties, CTA HEAD not included on original CTA and subsequent imaging resulted in delayed essentially non diagnostic phase. ANTERIOR CIRCULATION: Patent internal carotid arteries though limited assessment. Patent carotid terminus and proximal M1 segments. Limited assessment of ACA.  POSTERIOR CIRCULATION: Limited assessment of vertebral arteries. Basilar artery and proximal PCAs are patent. VENOUS SINUSES: Major dural venous sinuses are patent though not tailored for evaluation on this angiographic examination. ANATOMIC VARIANTS: Limited assessment. DELAYED PHASE: No abnormal intracranial enhancement. MIP images reviewed. IMPRESSION: CTA NECK: 1. 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 2. 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 Patent vertebral arteries. CTA HEAD: 1. Delayed phase, essentially nondiagnostic. No definite emergent large vessel occlusion. Acute findings discussed with and reconfirmed by Dr.Pollina on 08/05/2018 at 12:30 am. Electronically Signed   By: Elon Alas M.D.   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 EXAM: MRI HEAD WITHOUT CONTRAST MRA HEAD WITHOUT CONTRAST TECHNIQUE: Multiplanar, multiecho pulse sequences of the brain and surrounding structures were obtained without intravenous contrast. Angiographic images of the head were obtained using MRA technique without contrast. COMPARISON:  Intracranial MRA 0158 hours today. CTA head and neck 0015 hours. Head CT 2109 hours yesterday. FINDINGS: MRI HEAD FINDINGS Brain: No restricted diffusion or evidence of acute infarction. No cortical encephalomalacia or chronic cerebral blood products. Scattered small bilateral cerebral white matter T2 and FLAIR hyperintense foci, mostly subcortical and moderate for age. The deep gray matter nuclei, brainstem, and cerebellum appear normal. No midline shift, mass effect, evidence of mass lesion, ventriculomegaly, extra-axial collection or acute intracranial hemorrhage. Cervicomedullary  junction and pituitary are within normal limits. Vascular: Major intracranial vascular flow voids are preserved, and the distal cervical left ICA flow void has a normal appearance on T1, T2, and FLAIR. Skull and upper cervical spine: Negative visible cervical spine. Visualized bone marrow signal is within normal limits. Sinuses/Orbits: Negative orbits. Paranasal Visualized paranasal sinuses and mastoids are stable and well pneumatized. Other: Visible internal auditory structures appear normal. Scalp and face soft tissues appear negative. MRA HEAD FINDINGS Stable antegrade flow in the posterior circulation with mildly dominant left vertebral artery. Normal distal vertebral arteries, PICA origins and vertebrobasilar junction. Patent basilar artery without stenosis. Patent AICA and SCA origins. Patent and normal PCA origins. Posterior communicating arteries are diminutive or absent. Normal bilateral PCA branches. Stable and symmetric antegrade flow in both ICA siphons. More of the cervical ICAs are included on these images than earlier today. The left ICA at the C2 level has a somewhat flattened and beaded morphology on series 12, image 13. However on MIP images this more resembles fibromuscular dysplasia than carotid dissection (series 1115, image 3). Mildly beaded appearance of the contralateral right cervical ICA. No ICA siphon  stenosis. Ophthalmic artery origins appear within normal limits. Patent carotid termini. Normal MCA and ACA origins. The MCA bifurcations appear stable and patent. No convincing MCA bifurcation stenosis. Visible bilateral MCA branches are within normal limits. The right ACA A1 segment is mildly dominant. The anterior communicating artery and visible ACA branches are within normal limits. IMPRESSION: 1. Negative for acute infarct or other acute intracranial abnormality. 2. 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 Otherwise negative intracranial MRA. 3. Moderate for age nonspecific cerebral white matter signal changes. Electronically Signed   By: Genevie Ann M.D.   On: 08/05/2018 13:13   Mr Jodene Nam Head Wo Contrast  Result Date: 08/05/2018 CLINICAL DATA:  Follow up LEFT carotid artery dissection. RIGHT-sided weakness. EXAM: MRA HEAD WITHOUT CONTRAST TECHNIQUE: Angiographic images of the Circle of Willis were obtained using MRA technique without intravenous contrast. COMPARISON:  CT angiogram head August 05, 2018 FINDINGS: ANTERIOR CIRCULATION: Normal flow related enhancement of the included cervical, petrous, cavernous and supraclinoid internal carotid arteries. Patent anterior communicating artery. Patent anterior and middle cerebral arteries. Moderate stenosis LEFT M2 origin. No large vessel occlusion, aneurysm. POSTERIOR CIRCULATION: vertebral artery is dominant. Vertebrobasilar arteries are patent, with normal flow related enhancement of the main branch vessels. Patent posterior cerebral arteries. No large vessel occlusion, flow limiting stenosis,  aneurysm. ANATOMIC VARIANTS: None. Source images and MIP images were reviewed. IMPRESSION: 1. No emergent large vessel occlusion. 2. Moderate stenosis LEFT M2 origin. Electronically Signed   By: Elon Alas M.D.   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 EXAM: MRI HEAD WITHOUT CONTRAST MRA HEAD WITHOUT CONTRAST TECHNIQUE: Multiplanar, multiecho pulse sequences of the brain and surrounding structures were obtained without intravenous contrast. Angiographic images of the head were obtained using MRA technique without contrast. COMPARISON:   Intracranial MRA 0158 hours today. CTA head and neck 0015 hours. Head CT 2109 hours yesterday. FINDINGS: MRI HEAD FINDINGS Brain: No restricted diffusion or evidence of acute infarction. No cortical encephalomalacia or chronic cerebral blood products. Scattered small bilateral cerebral white matter T2 and FLAIR hyperintense foci, mostly subcortical and moderate for age. The deep gray matter nuclei, brainstem, and cerebellum appear normal. No midline shift, mass effect, evidence of mass lesion, ventriculomegaly, extra-axial collection or acute intracranial hemorrhage. Cervicomedullary junction and pituitary are within normal limits. Vascular: Major intracranial vascular flow voids are preserved, and the distal cervical left ICA flow void has a normal appearance on T1, T2, and FLAIR. Skull and upper cervical spine: Negative visible cervical spine. Visualized bone marrow signal is within normal limits. Sinuses/Orbits: Negative orbits. Paranasal Visualized paranasal sinuses and mastoids are stable and well pneumatized. Other: Visible internal auditory structures appear normal. Scalp and face soft tissues appear negative. MRA HEAD FINDINGS Stable antegrade flow in the posterior circulation with mildly dominant left vertebral artery. Normal distal vertebral arteries, PICA origins and vertebrobasilar junction. Patent basilar artery without stenosis. Patent AICA and SCA origins. Patent and normal PCA origins. Posterior communicating arteries are diminutive or absent. Normal bilateral PCA branches. Stable and symmetric antegrade flow in both ICA siphons. More of the cervical ICAs are included on these images than earlier today.  The left ICA at the C2 level has a somewhat flattened and beaded morphology on series 12, image 13. However on MIP images this more resembles fibromuscular dysplasia than carotid dissection (series 1115, image 3). Mildly beaded appearance of the contralateral right cervical ICA. No ICA siphon  stenosis. Ophthalmic artery origins appear within normal limits. Patent carotid termini. Normal MCA and ACA origins. The MCA bifurcations appear stable and patent. No convincing MCA bifurcation stenosis. Visible bilateral MCA branches are within normal limits. The right ACA A1 segment is mildly dominant. The anterior communicating artery and visible ACA branches are within normal limits. IMPRESSION: 1. Negative for acute infarct or other acute intracranial abnormality. 2. 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 Otherwise negative intracranial MRA. 3. Moderate for age nonspecific cerebral white matter signal changes. Electronically Signed   By: Genevie Ann M.D.   On: 08/05/2018 13:13    Micro Results   Today   Subjective    Jessica Lowe today has no new concerns, eating and drinking well, no chest pains no palpitations no dizziness, no visual disturbance, no speech problems, ambulating without gait concerns          Patient has been seen and examined prior to discharge   Objective   Blood pressure 134/79, pulse 70, temperature 97.6 F (36.4 C), temperature source Oral, resp. rate 17, last menstrual period 03/22/2013, SpO2 99 %.   Intake/Output Summary (Last 24 hours) at 08/06/2018 1348 Last data filed at 08/06/2018 0800 Gross per 24 hour  Intake 350 ml  Output -  Net 350 ml    Exam Gen:- Awake Alert, no acute distress , morbidly obese HEENT:- [place].AT, No sclera icterus Neck-Supple Neck,No JVD,.  Lungs-  CTAB , good air movement bilaterally  CV- S1, S2 normal, regular Abd-  +ve B.Sounds, Abd Soft, No tenderness,    Extremity/Skin:- No  edema,   good pulses Psych-affect is appropriate, oriented x3 Neuro-no new focal deficits, no tremors    Data Review   CBC w Diff:  Lab Results    Component Value Date   WBC 6.9 08/05/2018   HGB 11.9 (L) 08/05/2018   HGB 10.2 (L) 04/18/2007   HCT 38.7 08/05/2018   HCT 30.4 (L) 04/18/2007   PLT 335 08/05/2018   PLT 389 04/18/2007   LYMPHOPCT 36 08/04/2018   LYMPHOPCT 37.1 04/18/2007   MONOPCT 11 08/04/2018   MONOPCT 10.2 04/18/2007   EOSPCT 3 08/04/2018   EOSPCT 2.4 04/18/2007   BASOPCT 0 08/04/2018   BASOPCT 0.6 04/18/2007    CMP:  Lab Results  Component Value Date   NA 140 08/04/2018   K 4.0 08/04/2018   CL 100 08/04/2018   CO2 31 08/04/2018   BUN 12 08/04/2018   CREATININE 0.70 08/04/2018   CREATININE 0.63 04/20/2014   PROT 7.6 08/04/2018   ALBUMIN 3.4 (L) 08/04/2018   BILITOT 0.6 08/04/2018   ALKPHOS 56 08/04/2018   AST 16 08/04/2018   ALT 14 08/04/2018  .   Total Discharge time is about 33 minutes  Roxan Hockey M.D on 08/06/2018 at 1:48 PM  Pager---[phone]  Go to [email or web address] - password [email or web address] for contact info  [facility] - Office  [phone]

## 2018-09-19 ENCOUNTER — Ambulatory Visit: Payer: Self-pay | Admitting: Adult Health

## 2018-10-17 ENCOUNTER — Ambulatory Visit: Payer: 59 | Admitting: Neurology

## 2018-11-27 ENCOUNTER — Telehealth: Payer: Self-pay

## 2018-11-27 NOTE — Telephone Encounter (Signed)
error 

## 2018-11-27 NOTE — Telephone Encounter (Deleted)
I called pts sister and she was not at home. I also left message for pts brother in law that we are only doing telephone visit due to [place] 19. He will give her the message.

## 2018-11-27 NOTE — Telephone Encounter (Signed)
I tried to call pt to discuss doing telephone visit tomorrow due to [place] 19. VM was full unable to leave message. IF pt calls back I need consent for visit and to file insurance.

## 2018-11-27 NOTE — Telephone Encounter (Signed)
I called pt stating we are reducing pt care visit and are doing telephone visit due to COVID 19. I also stated we are getting telephone consent and consent to file insurance.Pt gave consent to file insurance and do visit consent.

## 2018-11-27 NOTE — Telephone Encounter (Signed)
Pt returned RN's call  He can be reached at [phone]

## 2018-11-27 NOTE — Telephone Encounter (Signed)
Me       11/27/18 12:29 PM  Note    I called pts sister and she was not at home. I also left message for pts brother in law that we are only doing telephone visit due to [place] 19. He will give her the message.

## 2018-11-28 ENCOUNTER — Encounter (INDEPENDENT_AMBULATORY_CARE_PROVIDER_SITE_OTHER): Payer: 59 | Admitting: Adult Health

## 2018-11-28 ENCOUNTER — Encounter: Payer: Self-pay | Admitting: Adult Health

## 2018-11-28 ENCOUNTER — Telehealth: Payer: Self-pay | Admitting: Adult Health

## 2018-11-28 ENCOUNTER — Other Ambulatory Visit: Payer: Self-pay

## 2018-11-28 NOTE — Telephone Encounter (Signed)
Attempted telephone visit today for 10:00 AM as confirmed yesterday with consent. No answer after calling at 10:05AM and 10:30AM. Voicemails left requesting call back each time in regards to the scheduled appointment. Please reschedule. Thank you

## 2018-11-28 NOTE — Progress Notes (Signed)
Attempted telephone visit but no answer. Left voicemail x2

## 2018-12-01 NOTE — Telephone Encounter (Signed)
Attempted to call patient to reschedule missed appointment. Patient did not answer and the voicemail box was full.

## 2020-04-12 IMAGING — MR MR MRA HEAD W/O CM
10 of 12 series · 29 of 48 positions shown · non-contrast
Comparison: Intracranial MRA 5478 hours today. CTA head and neck
3394 hours. Head CT 9149 hours yesterday.

CLINICAL DATA: 52-year-old female with cervical left carotid
dissection suspected on CTA head and neck earlier today following
presentation with right side weakness and garbled speech.

EXAM:
MRI HEAD WITHOUT CONTRAST
MRA HEAD WITHOUT CONTRAST
TECHNIQUE: Multiplanar, multiecho pulse sequences of the brain and surrounding
structures were obtained without intravenous contrast. Angiographic
images of the head were obtained using MRA technique without
contrast.

[Series 5: DWI · axial · 3.0mm · 0.88mm/px · z∈[-92,+37]mm · 5 of 88 slices shown (1 of 4)]
[im 1/88]
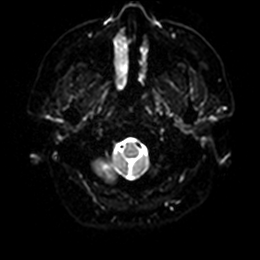
[im 22/88]
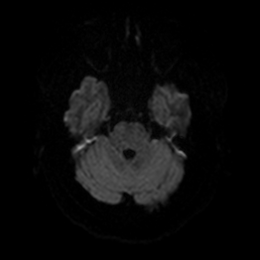
[im 44/88]
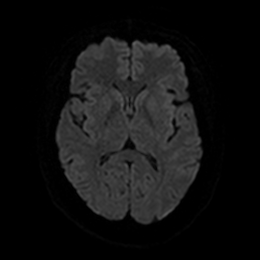
[im 66/88]
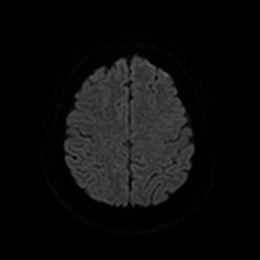
[im 88/88]
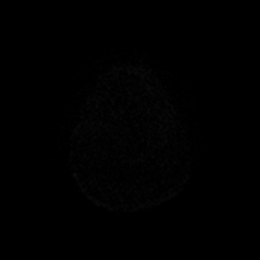

[Series 6: DWI · axial · 3.0mm · 0.88mm/px · z∈[-92,+37]mm · 3 of 44 slices shown (2 of 4)]
[im 1/44]
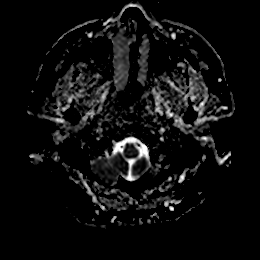
[im 22/44]
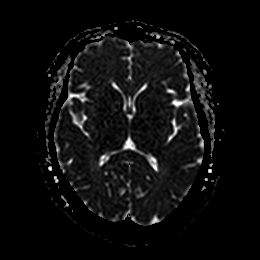
[im 44/44]
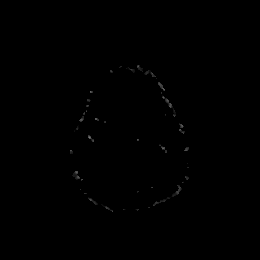

[Series 7: DWI · coronal · 4.0mm · 0.88mm/px · 4 of 64 slices shown (3 of 4)]
[im 1/64]
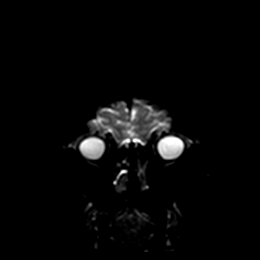
[im 22/64]
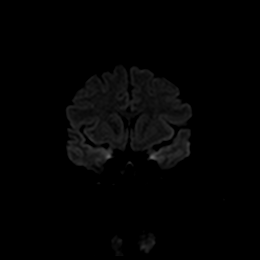
[im 43/64]
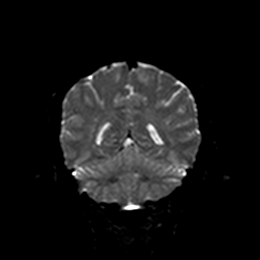
[im 64/64]
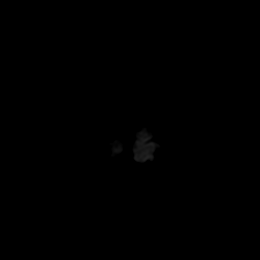

[Series 8: DWI · coronal · 4.0mm · 0.88mm/px · 2 of 32 slices shown (4 of 4)]
[im 1/32]
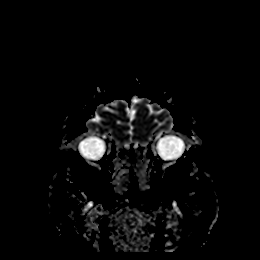
[im 32/32]
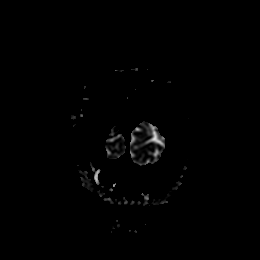

[Series 9: T1 · sagittal · 5.0mm · 0.75mm/px · 2 of 23 slices shown]
[im 1/23]
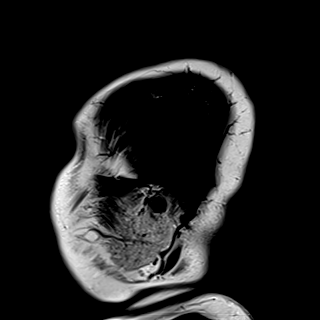
[im 23/23]
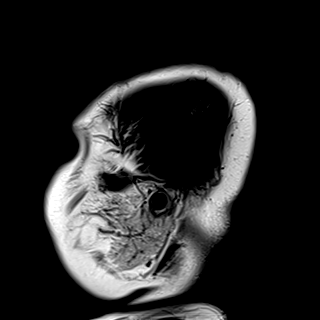

[Series 10: T2 · axial · 5.0mm · 0.72mm/px · z∈[-107,+37]mm · 2 of 25 slices shown (1 of 2)]
[im 1/25]
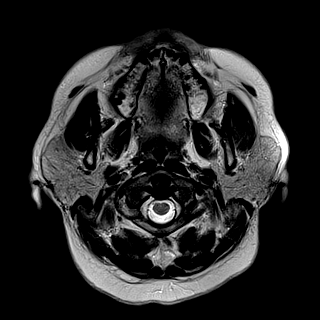
[im 25/25]
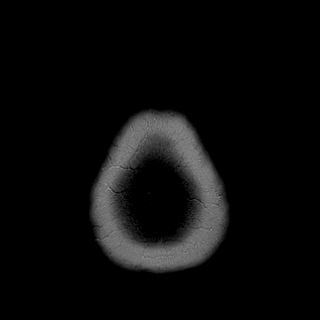

[Series 11: FLAIR · axial · 5.0mm · 0.45mm/px · z∈[-106,+38]mm · 2 of 25 slices shown]
[im 1/25]
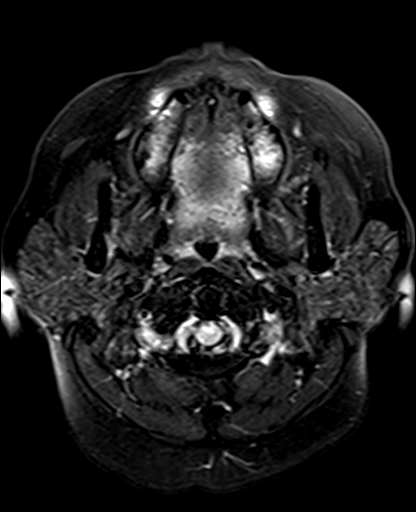
[im 25/25]
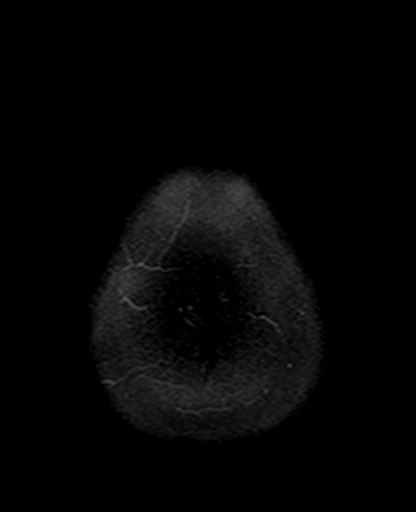

[Series 16: swi_images · axial · 3.0mm · 0.90mm/px · z∈[-112,+40]mm · 4 of 52 slices shown]
[im 1/52]
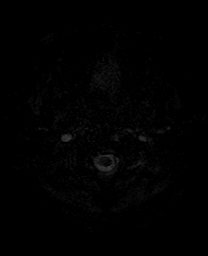
[im 18/52]
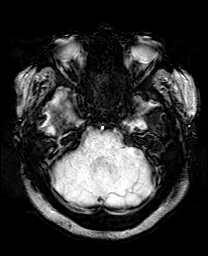
[im 35/52]
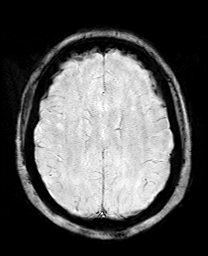
[im 52/52]
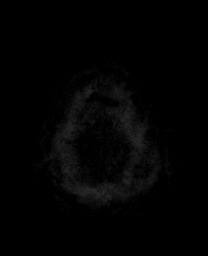

[Series 17: mip_images(sw) · axial · 24.0mm · 0.90mm/px · z∈[-102,+30]mm · 3 of 45 slices shown]
[im 1/45]
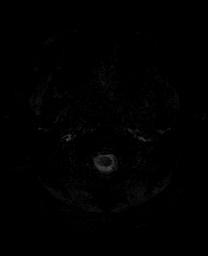
[im 23/45]
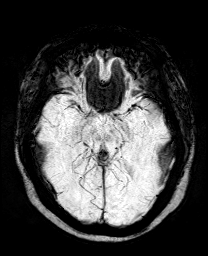
[im 45/45]
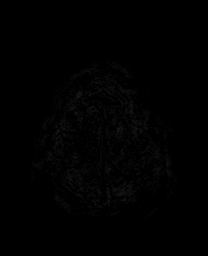

[Series 19: T2 · coronal · 5.0mm · 0.34mm/px · 2 of 29 slices shown (2 of 2)]
[im 1/29]
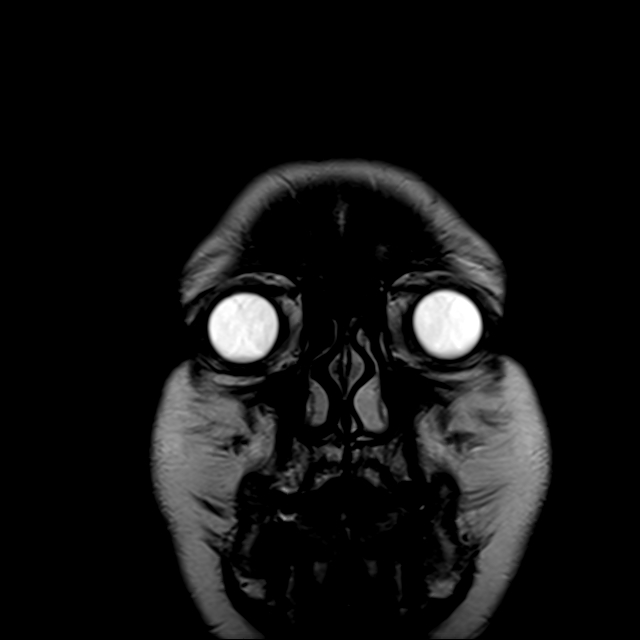
[im 29/29]
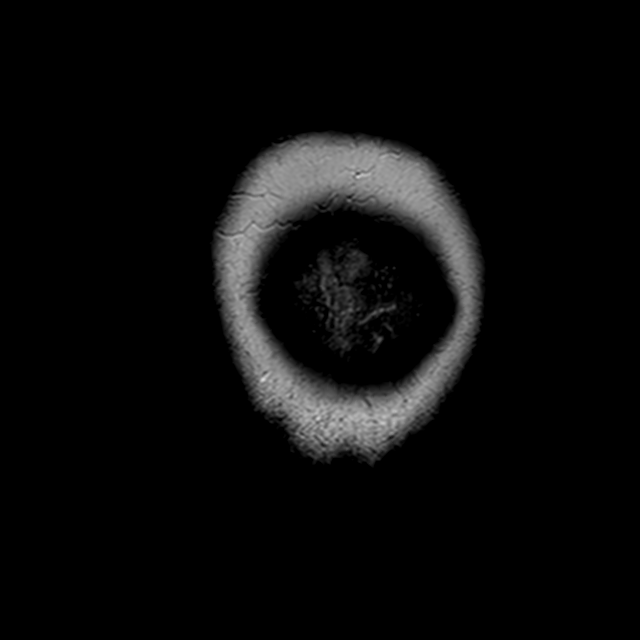

[29 of 48 positions shown; findings below may reference images not displayed]

FINDINGS: MRI HEAD FINDINGS

Brain: No restricted diffusion or evidence of acute infarction.

No cortical encephalomalacia or chronic cerebral blood products.

Scattered small bilateral cerebral white matter T2 and FLAIR
hyperintense foci, mostly subcortical and moderate for age. The deep
gray matter nuclei, brainstem, and cerebellum appear normal.

No midline shift, mass effect, evidence of mass lesion,
ventriculomegaly, extra-axial collection or acute intracranial
hemorrhage. Cervicomedullary junction and pituitary are within
normal limits.

Vascular: Major intracranial vascular flow voids are preserved, and
the distal cervical left ICA flow void has a normal appearance on
T1, T2, and FLAIR.

Skull and upper cervical spine: Negative visible cervical spine.
Visualized bone marrow signal is within normal limits.

Sinuses/Orbits: Negative orbits. Paranasal Visualized paranasal
sinuses and mastoids are stable and well pneumatized.

Other: Visible internal auditory structures appear normal. Scalp and
face soft tissues appear negative.

MRA HEAD FINDINGS

Stable antegrade flow in the posterior circulation with mildly
dominant left vertebral artery. Normal distal vertebral arteries,
PICA origins and vertebrobasilar junction. Patent basilar artery
without stenosis. Patent AICA and SCA origins. Patent and normal PCA
origins. Posterior communicating arteries are diminutive or absent.
Normal bilateral PCA branches.

Stable and symmetric antegrade flow in both ICA siphons. More of the
cervical ICAs are included on these images than earlier today. The
left ICA at the C2 level has a somewhat flattened and beaded
morphology on series 12, image 13. However on MIP images this more
resembles fibromuscular dysplasia than carotid dissection (series
0009, image 3). Mildly beaded appearance of the contralateral right
cervical ICA.

No ICA siphon stenosis. Ophthalmic artery origins appear within
normal limits. Patent carotid termini. Normal MCA and ACA origins.
The MCA bifurcations appear stable and patent. No convincing MCA
bifurcation stenosis. Visible bilateral MCA branches are within
normal limits.

The right ACA A1 segment is mildly dominant. The anterior
communicating artery and visible ACA branches are within normal
limits.
IMPRESSION: 1. Negative for acute infarct or other acute intracranial
abnormality.
2. MRA circle of Willis including most of the cervical ICAs:
An irregular and beaded appearance of the cervical Left ICA more
resembles Fibromuscular Dysplasia (FMD) than dissection on this
MRI/MRA.
Possible mild contralateral right ICA FMD.
Otherwise negative intracranial MRA.
3. Moderate for age nonspecific cerebral white matter signal
changes.

## 2023-06-07 ENCOUNTER — Ambulatory Visit
Admission: RE | Admit: 2023-06-07 | Discharge: 2023-06-07 | Disposition: A | Discharge status: Home / Self Care | Payer: No Typology Code available for payment source | Source: Ambulatory Visit | Attending: Infectious Diseases | Admitting: Infectious Diseases

## 2023-06-07 ENCOUNTER — Other Ambulatory Visit: Payer: Self-pay | Admitting: Infectious Diseases

## 2023-06-07 DIAGNOSIS — R7611 Nonspecific reaction to tuberculin skin test without active tuberculosis: Secondary | ICD-10-CM

## 2023-08-29 ENCOUNTER — Other Ambulatory Visit: Payer: Self-pay

## 2023-08-29 ENCOUNTER — Emergency Department (HOSPITAL_COMMUNITY): Payer: No Typology Code available for payment source

## 2023-08-29 ENCOUNTER — Emergency Department (HOSPITAL_COMMUNITY)
Admission: EM | Admit: 2023-08-29 | Discharge: 2023-08-29 | Disposition: A | Discharge status: Home / Self Care | Payer: No Typology Code available for payment source | Attending: Emergency Medicine | Admitting: Emergency Medicine

## 2023-08-29 ENCOUNTER — Encounter (HOSPITAL_COMMUNITY): Payer: Self-pay

## 2023-08-29 DIAGNOSIS — E119 Type 2 diabetes mellitus without complications: Secondary | ICD-10-CM | POA: Insufficient documentation

## 2023-08-29 DIAGNOSIS — Z7984 Long term (current) use of oral hypoglycemic drugs: Secondary | ICD-10-CM | POA: Insufficient documentation

## 2023-08-29 DIAGNOSIS — I10 Essential (primary) hypertension: Secondary | ICD-10-CM | POA: Insufficient documentation

## 2023-08-29 DIAGNOSIS — R519 Headache, unspecified: Secondary | ICD-10-CM | POA: Insufficient documentation

## 2023-08-29 DIAGNOSIS — R42 Dizziness and giddiness: Secondary | ICD-10-CM | POA: Insufficient documentation

## 2023-08-29 DIAGNOSIS — Z79899 Other long term (current) drug therapy: Secondary | ICD-10-CM | POA: Insufficient documentation

## 2023-08-29 LAB — COMPREHENSIVE METABOLIC PANEL
ALT: 14 U/L (ref 0–44)
AST: 13 U/L — ABNORMAL LOW (ref 15–41)
Albumin: 3.4 g/dL — ABNORMAL LOW (ref 3.5–5.0)
Alkaline Phosphatase: 67 U/L (ref 38–126)
Anion gap: 9 (ref 5–15)
BUN: 11 mg/dL (ref 6–20)
CO2: 25 mmol/L (ref 22–32)
Calcium: 9.3 mg/dL (ref 8.9–10.3)
Chloride: 105 mmol/L (ref 98–111)
Creatinine, Ser: 0.89 mg/dL (ref 0.44–1.00)
GFR, Estimated: >60 mL/min (ref >60)
Glucose, Bld: 92 mg/dL (ref 70–99)
Potassium: 4.1 mmol/L (ref 3.5–5.1)
Sodium: 139 mmol/L (ref 135–145)
Total Bilirubin: 0.9 mg/dL (ref <1.2)
Total Protein: 7.3 g/dL (ref 6.5–8.1)

## 2023-08-29 LAB — CBC WITH DIFFERENTIAL/PLATELET
Abs Immature Granulocytes: 0.01 10*3/uL (ref 0.00–0.07)
Basophils Absolute: 0 10*3/uL (ref 0.0–0.1)
Basophils Relative: 0 %
Eosinophils Absolute: 0.1 10*3/uL (ref 0.0–0.5)
Eosinophils Relative: 1 %
HCT: 36.5 % (ref 36.0–46.0)
Hemoglobin: 11.6 g/dL — ABNORMAL LOW (ref 12.0–15.0)
Immature Granulocytes: 0 %
Lymphocytes Relative: 33 %
Lymphs Abs: 2 10*3/uL (ref 0.7–4.0)
MCH: 22.7 pg — ABNORMAL LOW (ref 26.0–34.0)
MCHC: 31.8 g/dL (ref 30.0–36.0)
MCV: 71.3 fL — ABNORMAL LOW (ref 80.0–100.0)
Monocytes Absolute: 0.4 10*3/uL (ref 0.1–1.0)
Monocytes Relative: 7 %
Neutro Abs: 3.5 10*3/uL (ref 1.7–7.7)
Neutrophils Relative %: 59 %
Platelets: 370 10*3/uL (ref 150–400)
RBC: 5.12 MIL/uL — ABNORMAL HIGH (ref 3.87–5.11)
RDW: 16.6 % — ABNORMAL HIGH (ref 11.5–15.5)
WBC: 5.9 10*3/uL (ref 4.0–10.5)
nRBC: 0 % (ref 0.0–0.2)

## 2023-08-29 MED ORDER — MECLIZINE HCL 25 MG PO TABS: 25.0000 mg | ORAL_TABLET | Freq: Three times a day (TID) | ORAL | 0 refills | Status: AC | PRN

## 2023-08-29 MED ORDER — DIAZEPAM 5 MG/ML IJ SOLN: 2.5000 mg | Freq: Once | INTRAMUSCULAR | Status: DC

## 2023-08-29 MED ORDER — IOHEXOL 350 MG/ML SOLN
75.0000 mL | Freq: Once | INTRAVENOUS | Status: AC | PRN
  Administered 2023-08-29: 75 mL via INTRAVENOUS

## 2023-08-29 NOTE — Discharge Instructions (Addendum)
1.  You may take exercise Tylenol for headache.  You may take Antivert (meclizine) as prescribed for spinning quality dizziness. 2.  Schedule a recheck with your doctor within the next 3 to 7 days. 3.  Return to Emergency Department immediately if you have new worsening or concerning symptoms.

## 2023-08-29 NOTE — ED Provider Notes (Signed)
[place] EMERGENCY DEPARTMENT AT [facility] Provider Note   CSN: [id number] Arrival date & time: 08/29/23  1055     History  Chief Complaint  Patient presents with   Dizziness    Jessica Lowe is a 57 y.o. female.  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  Apparently MRI/MRA did not show dissection and patient was discharged with plan for indefinitely being treated with Plavix.  Patient reports she developed dizziness and blurred vision 4 days ago.  She reports symptoms are worse if she moves her head too quickly or turns.  Patient reports she is experiencing blurred vision and it does wax and wane in severity relative to her position change.  She reports she did have somewhat of a frontal headache in association with symptoms but does not qualify it as severe.    Home Medications Prior to Admission medications   Medication Sig Start Date End Date Taking? Authorizing Provider  meclizine (ANTIVERT) 25 MG tablet Take 1 tablet (25 mg total) by mouth 3 (three) times daily as needed for dizziness. 08/29/23  Yes Arby Barrette, MD  amLODipine (NORVASC) 10 MG tablet Take 1 tablet (10 mg total) by mouth daily. 08/06/18 08/06/19  Shon Hale, MD  aspirin 81 MG EC tablet Take 1 tablet (81 mg total) by mouth daily with breakfast. Swallow whole. 08/06/18   Shon Hale, MD  atorvastatin (LIPITOR) 20 MG tablet Take 1 tablet (20 mg total) by mouth every evening. 08/06/18   Shon Hale, MD  clopidogrel (PLAVIX) 75 MG tablet Take 1 tablet (75 mg total) by mouth daily. For Blood Thinner 08/06/18   Shon Hale, MD  losartan (COZAAR) 100 MG tablet Take 1 tablet (100 mg total) by mouth daily. 08/06/18   Shon Hale, MD  metFORMIN (GLUCOPHAGE) 500 MG tablet Take 1 tablet (500 mg total) by mouth daily with breakfast. Patient taking differently: Take 500 mg  by mouth 2 (two) times daily with a meal.  11/26/14   Ambrose Finland, NP  traMADol (ULTRAM) 50 MG tablet Take 1 tablet (50 mg total) by mouth every 8 (eight) hours as needed. 02/18/15   Henrietta Hoover, NP  triamterene-hydrochlorothiazide (MAXZIDE-25) 37.5-25 MG tablet Take 0.5 tablets by mouth daily. Take half Tablet daily 08/06/18   Shon Hale, MD      Allergies    Patient has no known allergies.    Review of Systems   Review of Systems  Physical Exam Updated Vital Signs BP 122/73 (BP Location: Right Arm)   Pulse 79   Temp 97.8 F (36.6 C) (Oral)   Resp 16   Ht 5\' 8"  (1.727 m)   Wt (!) 166 kg   LMP 03/22/2013   SpO2 100%   BMI 55.65 kg/m  Physical Exam Constitutional:      Comments: Alert nontoxic.  Normal mental status.  No respiratory distress.  HENT:     Mouth/Throat:     Pharynx: Oropharynx is clear.  Eyes:     Extraocular Movements: Extraocular movements intact.     Conjunctiva/sclera: Conjunctivae normal.     Pupils: Pupils are equal, round, and reactive to light.  Cardiovascular:     Rate and Rhythm: Normal rate and regular rhythm.  Pulmonary:     Effort: Pulmonary effort is normal.     Breath sounds: Normal breath sounds.  Abdominal:     General: There is no distension.  Palpations: Abdomen is soft.     Tenderness: There is no abdominal tenderness. There is no guarding.  Musculoskeletal:        General: No swelling or tenderness. Normal range of motion.     Cervical back: Neck supple.     Right lower leg: No edema.     Left lower leg: No edema.  Skin:    General: Skin is warm and dry.  Neurological:     General: No focal deficit present.     Mental Status: She is oriented to person, place, and time.     Motor: No weakness.     Coordination: Coordination normal.     Comments: Speech clear.  Normal content normal cadence.  Cognitive function intact.  Normal finger-nose exam bilaterally.  No motor weakness deficit.  Psychiatric:        Mood  and Affect: Mood normal.     ED Results / Procedures / Treatments   Labs (all labs ordered are listed, but only abnormal results are displayed) Labs Reviewed  CBC WITH DIFFERENTIAL/PLATELET - Abnormal; Notable for the following components:      Result Value   RBC 5.12 (*)    Hemoglobin 11.6 (*)    MCV 71.3 (*)    MCH 22.7 (*)    RDW 16.6 (*)    All other components within normal limits  COMPREHENSIVE METABOLIC PANEL - Abnormal; Notable for the following components:   Albumin 3.4 (*)    AST 13 (*)    All other components within normal limits    EKG None  Radiology No results found.  Procedures Procedures    Medications Ordered in ED Medications  iohexol (OMNIPAQUE) 350 MG/ML injection 75 mL (75 mLs Intravenous Contrast Given 08/29/23 1459)    ED Course/ Medical Decision Making/ A&P                                 Medical Decision Making Amount and/or Complexity of Data Reviewed Labs: ordered. Radiology: ordered.  Risk Prescription drug management.   Has had vertiginous quality dizziness.  She also had a onset of frontal headache.  Review of EMR suggested patient had at 1 point had questionable internal carotid dissection.  Ultimately it appears MRI and MRA did not show acute findings at that time.  The patient has a nonfocal neurologic exam.  She is ambulatory and does not have any visual deficit although endorses some blurred vision.  At this time with onset of vertigo in conjunction with headache we will proceed with CT angiogram.  Patient's blood pressures are normotensive.  No indication of hypertensive emergency or urgency.  No focal deficits on exam.  At this time do not suspect acute stroke pattern.  CTA by radiology no acute findings.  I have reviewed findings with the patient.  She is alert and appropriate.  She has been up and ambulatory with coordinated gait and no focal neurologic deficits.  At this time with negative CTA and well appearance and stable  vital signs, will plan for discharge with close follow-up with PCP.  For vertiginous quality symptoms have prescribed trial of Antivert.  Careful return precautions reviewed.        Final Clinical Impression(s) / ED Diagnoses Final diagnoses:  Vertigo  Acute nonintractable headache, unspecified headache type    Rx / DC Orders ED Discharge Orders          Ordered  meclizine (ANTIVERT) 25 MG tablet  3 times daily PRN        08/29/23 1635              Arby Barrette, MD 08/29/23 1643

## 2023-08-29 NOTE — ED Triage Notes (Signed)
Patient reports dizziness, vertigo, and a little bit of nausea since Monday. Patient reports worse with turning her head or moving too fast. Hx of diabetes, HTN.
